# Patient Record
Sex: Male | Born: 1970 | Race: Black or African American | Hispanic: No | Marital: Married | Smoking: Current every day smoker
Health system: Southern US, Community
[De-identification: ages and names within clinical notes are randomized; demographics above are authoritative.]

## PROBLEM LIST (undated history)

## (undated) DIAGNOSIS — R7303 Prediabetes: Secondary | ICD-10-CM

## (undated) DIAGNOSIS — I10 Essential (primary) hypertension: Secondary | ICD-10-CM

## (undated) HISTORY — PX: APPENDECTOMY: SHX54

## (undated) HISTORY — DX: Prediabetes: R73.03

## (undated) HISTORY — PX: LAPAROSCOPIC GASTRIC SLEEVE RESECTION: SHX5895

---

## 2008-07-09 DIAGNOSIS — E291 Testicular hypofunction: Secondary | ICD-10-CM | POA: Insufficient documentation

## 2011-10-14 DIAGNOSIS — Z720 Tobacco use: Secondary | ICD-10-CM | POA: Insufficient documentation

## 2011-10-14 DIAGNOSIS — I10 Essential (primary) hypertension: Secondary | ICD-10-CM | POA: Insufficient documentation

## 2011-10-14 DIAGNOSIS — E669 Obesity, unspecified: Secondary | ICD-10-CM | POA: Insufficient documentation

## 2011-11-10 DIAGNOSIS — G4700 Insomnia, unspecified: Secondary | ICD-10-CM | POA: Insufficient documentation

## 2011-11-10 DIAGNOSIS — J309 Allergic rhinitis, unspecified: Secondary | ICD-10-CM | POA: Insufficient documentation

## 2011-12-02 DIAGNOSIS — G4733 Obstructive sleep apnea (adult) (pediatric): Secondary | ICD-10-CM | POA: Insufficient documentation

## 2011-12-09 DIAGNOSIS — N5089 Other specified disorders of the male genital organs: Secondary | ICD-10-CM | POA: Insufficient documentation

## 2012-12-01 DIAGNOSIS — N529 Male erectile dysfunction, unspecified: Secondary | ICD-10-CM | POA: Insufficient documentation

## 2014-06-28 DIAGNOSIS — Z9884 Bariatric surgery status: Secondary | ICD-10-CM | POA: Insufficient documentation

## 2014-07-01 DIAGNOSIS — E46 Unspecified protein-calorie malnutrition: Secondary | ICD-10-CM | POA: Insufficient documentation

## 2014-09-01 ENCOUNTER — Emergency Department (HOSPITAL_BASED_OUTPATIENT_CLINIC_OR_DEPARTMENT_OTHER)
Admission: EM | Admit: 2014-09-01 | Discharge: 2014-09-01 | Disposition: A | Discharge status: Home / Self Care | Payer: BLUE CROSS/BLUE SHIELD | Attending: Emergency Medicine | Admitting: Emergency Medicine

## 2014-09-01 ENCOUNTER — Emergency Department (HOSPITAL_BASED_OUTPATIENT_CLINIC_OR_DEPARTMENT_OTHER): Payer: BLUE CROSS/BLUE SHIELD

## 2014-09-01 ENCOUNTER — Encounter (HOSPITAL_BASED_OUTPATIENT_CLINIC_OR_DEPARTMENT_OTHER): Payer: Self-pay | Admitting: *Deleted

## 2014-09-01 DIAGNOSIS — S62622A Displaced fracture of medial phalanx of right middle finger, initial encounter for closed fracture: Secondary | ICD-10-CM | POA: Diagnosis not present

## 2014-09-01 DIAGNOSIS — S0990XA Unspecified injury of head, initial encounter: Secondary | ICD-10-CM | POA: Diagnosis present

## 2014-09-01 DIAGNOSIS — S060X1A Concussion with loss of consciousness of 30 minutes or less, initial encounter: Secondary | ICD-10-CM | POA: Diagnosis not present

## 2014-09-01 DIAGNOSIS — Z79899 Other long term (current) drug therapy: Secondary | ICD-10-CM | POA: Diagnosis not present

## 2014-09-01 DIAGNOSIS — I10 Essential (primary) hypertension: Secondary | ICD-10-CM | POA: Diagnosis not present

## 2014-09-01 DIAGNOSIS — R52 Pain, unspecified: Secondary | ICD-10-CM

## 2014-09-01 DIAGNOSIS — S8002XA Contusion of left knee, initial encounter: Secondary | ICD-10-CM | POA: Diagnosis not present

## 2014-09-01 DIAGNOSIS — Y998 Other external cause status: Secondary | ICD-10-CM | POA: Diagnosis not present

## 2014-09-01 DIAGNOSIS — Y92410 Unspecified street and highway as the place of occurrence of the external cause: Secondary | ICD-10-CM | POA: Insufficient documentation

## 2014-09-01 DIAGNOSIS — Y9389 Activity, other specified: Secondary | ICD-10-CM | POA: Diagnosis not present

## 2014-09-01 DIAGNOSIS — S62609A Fracture of unspecified phalanx of unspecified finger, initial encounter for closed fracture: Secondary | ICD-10-CM

## 2014-09-01 DIAGNOSIS — Z87891 Personal history of nicotine dependence: Secondary | ICD-10-CM | POA: Insufficient documentation

## 2014-09-01 HISTORY — DX: Essential (primary) hypertension: I10

## 2014-09-01 MED ORDER — MORPHINE SULFATE 4 MG/ML IJ SOLN
6.0000 mg | Freq: Once | INTRAMUSCULAR | Status: AC
  Administered 2014-09-01: 6 mg via INTRAMUSCULAR
  Filled 2014-09-01: qty 2

## 2014-09-01 MED ORDER — HYDROCODONE-ACETAMINOPHEN 5-325 MG PO TABS: 1.0000 | ORAL_TABLET | Freq: Four times a day (QID) | ORAL | Status: DC | PRN

## 2014-09-01 MED ORDER — NAPROXEN 500 MG PO TABS: 500.0000 mg | ORAL_TABLET | Freq: Two times a day (BID) | ORAL | Status: DC
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
## 2016-08-16 ENCOUNTER — Encounter: Payer: Self-pay | Admitting: *Deleted

## 2016-08-16 ENCOUNTER — Emergency Department
Admission: EM | Admit: 2016-08-16 | Discharge: 2016-08-16 | Disposition: A | Discharge status: Home / Self Care | Payer: BLUE CROSS/BLUE SHIELD | Source: Home / Self Care | Attending: Family Medicine | Admitting: Family Medicine

## 2016-08-16 DIAGNOSIS — I10 Essential (primary) hypertension: Secondary | ICD-10-CM

## 2016-08-16 DIAGNOSIS — R51 Headache: Secondary | ICD-10-CM | POA: Diagnosis not present

## 2016-08-16 DIAGNOSIS — Z9114 Patient's other noncompliance with medication regimen: Secondary | ICD-10-CM

## 2016-08-16 DIAGNOSIS — F17200 Nicotine dependence, unspecified, uncomplicated: Secondary | ICD-10-CM

## 2016-08-16 DIAGNOSIS — R519 Headache, unspecified: Secondary | ICD-10-CM

## 2016-08-16 MED ORDER — AMLODIPINE BESYLATE 5 MG PO TABS: 5.0000 mg | ORAL_TABLET | Freq: Every day | ORAL | 0 refills | Status: DC

## 2016-08-16 MED ORDER — HYDROCHLOROTHIAZIDE 25 MG PO TABS: 25.0000 mg | ORAL_TABLET | Freq: Every day | ORAL | 0 refills | Status: DC

## 2016-08-16 NOTE — Discharge Instructions (Signed)
°  It is very important to establish care with a primary care provider (family doctor) to help monitor and treat your blood pressure.  If you blood pressure continues to be high and uncontrolled, you are at an increased risk of stroke, kidney failure, heart disease, and early death.

## 2016-08-16 NOTE — ED Triage Notes (Signed)
Patient c/o HA since yesterday that woke him up from sleep. He denies any associated symptoms. Taken Aleve @ 4am with some relief. He has been out of antihypertensives for 2 1/2 weeks. He does not have a PCP, reports he gets BP meds from "[facility]".  HCTZ 25mg  and Amlodipine 25mg 

## 2016-08-16 NOTE — ED Provider Notes (Signed)
CSN: [id number]     Arrival date & time 08/16/16  1059 History   First MD Initiated Contact with Patient 08/16/16 1135     Chief Complaint  Patient presents with  . Headache  . Hypertension   (Consider location/radiation/quality/duration/timing/severity/associated sxs/prior Treatment) HPI  Todd Cruz is a 46 y.o. male presenting to UC with c/o generalized headache that is worse in the front left of his head since last night.  HA woke him from his sleep around 4AM, he took Aleve, which allowed him to fall back asleep. He reports hx of prior similar HAs and known hx of HTN but states he has been out of his BP medication, HCTZ and amlodipine, for about 2-3 weeks.  He does not have a PCP but is interested in establishing care at this time.  Denies chest pain or SOB. Denies one sided weakness or numbness. No change in vision or nausea.  Pt requesting refill of his BP medication as well as asking about medication to help him stop smoking. BP in triage- 163/108 pt notes this is "good" for him as he has hx of systolic BP being in 200s.   Past Medical History:  Diagnosis Date  . Hypertension    Past Surgical History:  Procedure Laterality Date  . APPENDECTOMY    . LAPAROSCOPIC GASTRIC SLEEVE RESECTION     Family History  Problem Relation Age of Onset  . Hypertension Mother   . Stroke Mother   . Hypertension Father   . Diabetes Father   . Heart attack Father    Social History  Substance Use Topics  . Smoking status: Current Every Day Smoker    Last attempt to quit: 05/12/2014  . Smokeless tobacco: Never Used  . Alcohol use Yes     Comment: occasionally    Review of Systems  Eyes: Negative for photophobia, pain, discharge and visual disturbance.  Respiratory: Negative for cough and chest tightness.   Cardiovascular: Negative for chest pain, palpitations and leg swelling.  Gastrointestinal: Negative for nausea and vomiting.  Musculoskeletal: Negative for neck pain and neck  stiffness.  Neurological: Positive for headaches. Negative for dizziness, syncope, weakness, light-headedness and numbness.    Allergies  Lisinopril  Home Medications   Prior to Admission medications   Medication Sig Start Date End Date Taking? Authorizing Provider  amLODipine (NORVASC) 5 MG tablet Take 5 mg by mouth daily.    Historical Provider, MD  amLODipine (NORVASC) 5 MG tablet Take 1 tablet (5 mg total) by mouth daily. 08/16/16   Junius FinnerErin O'Malley, PA-C  hydrochlorothiazide (HYDRODIURIL) 25 MG tablet Take 1 tablet (25 mg total) by mouth daily. 08/16/16   Junius FinnerErin O'Malley, PA-C   Meds Ordered and Administered this Visit  Medications - No data to display  BP (!) 163/108 (BP Location: Left Arm)   Pulse 75   Temp 98.3 F (36.8 C) (Oral)   Resp 16   Wt (!) 325 lb (147.4 kg)   SpO2 99%   BMI 47.99 kg/m  No data found.   Physical Exam  Constitutional: He is oriented to person, place, and time. He appears well-developed and well-nourished. No distress.  Pt sitting in exam chair, NAD. Watching television.   HENT:  Head: Normocephalic and atraumatic.  Eyes: EOM are normal. Pupils are equal, round, and reactive to light.  Neck: Normal range of motion. Neck supple.  Cardiovascular: Normal rate and regular rhythm.   Pulmonary/Chest: Effort normal and breath sounds normal. No stridor. No respiratory distress. He  has no wheezes. He has no rales.  Musculoskeletal: Normal range of motion.  Lymphadenopathy:    He has no cervical adenopathy.  Neurological: He is alert and oriented to person, place, and time. No cranial nerve deficit.  CN II-XII in tact. Speech is clear, alert to person, place and time. Normal finger to nose coordination. 5/5 strength in upper and lower extremities bilaterally. Normal gait.  Skin: Skin is warm and dry. He is not diaphoretic.  Psychiatric: He has a normal mood and affect. His behavior is normal.  Nursing note and vitals reviewed.   Urgent Care Course      Procedures (including critical care time)  Labs Review Labs Reviewed - No data to display  Imaging Review No results found.    MDM   1. Generalized headache   2. Uncontrolled hypertension   3. Noncompliance with medications   4. Current smoker    Pt presenting with HA and elevated BP, likely due to uncontrolled HTN from medication non-compliance as he does not currently have a PCP. Pt also requesting medication for smoking cessation despite having quit date in EPIC as 05/12/14.  No red flag symptoms for HA. HA c/w prior and BP "good" per pt.  Will refill his HCTZ and Amlodipine. Resources for PCP provided. Encouraged to f/u within 1-2 weeks for new patient care. Discussed symptoms that warrant emergent care in the ED.     Junius Finner, PA-C 08/16/16 1223    Junius Finner, PA-C 08/16/16 1226

## 2016-08-17 ENCOUNTER — Ambulatory Visit (INDEPENDENT_AMBULATORY_CARE_PROVIDER_SITE_OTHER): Payer: BLUE CROSS/BLUE SHIELD | Admitting: Osteopathic Medicine

## 2016-08-17 ENCOUNTER — Encounter: Payer: Self-pay | Admitting: Osteopathic Medicine

## 2016-08-17 VITALS — BP 174/114 | Ht 67.0 in | Wt 328.0 lb

## 2016-08-17 DIAGNOSIS — Z8249 Family history of ischemic heart disease and other diseases of the circulatory system: Secondary | ICD-10-CM | POA: Insufficient documentation

## 2016-08-17 DIAGNOSIS — N529 Male erectile dysfunction, unspecified: Secondary | ICD-10-CM

## 2016-08-17 DIAGNOSIS — Z9884 Bariatric surgery status: Secondary | ICD-10-CM

## 2016-08-17 DIAGNOSIS — Z720 Tobacco use: Secondary | ICD-10-CM

## 2016-08-17 DIAGNOSIS — Z6841 Body Mass Index (BMI) 40.0 and over, adult: Secondary | ICD-10-CM

## 2016-08-17 DIAGNOSIS — R946 Abnormal results of thyroid function studies: Secondary | ICD-10-CM

## 2016-08-17 DIAGNOSIS — G4733 Obstructive sleep apnea (adult) (pediatric): Secondary | ICD-10-CM

## 2016-08-17 DIAGNOSIS — IMO0001 Reserved for inherently not codable concepts without codable children: Secondary | ICD-10-CM

## 2016-08-17 DIAGNOSIS — E291 Testicular hypofunction: Secondary | ICD-10-CM | POA: Diagnosis not present

## 2016-08-17 DIAGNOSIS — E669 Obesity, unspecified: Secondary | ICD-10-CM

## 2016-08-17 DIAGNOSIS — I10 Essential (primary) hypertension: Secondary | ICD-10-CM

## 2016-08-17 DIAGNOSIS — Z833 Family history of diabetes mellitus: Secondary | ICD-10-CM | POA: Insufficient documentation

## 2016-08-17 DIAGNOSIS — R7989 Other specified abnormal findings of blood chemistry: Secondary | ICD-10-CM

## 2016-08-17 LAB — CBC WITH DIFFERENTIAL/PLATELET
BASOS ABS: 111 {cells}/uL (ref 0–200)
Basophils Relative: 1 %
Eosinophils Absolute: 777 cells/uL — ABNORMAL HIGH (ref 15–500)
Eosinophils Relative: 7 %
HCT: 44.2 % (ref 38.5–50.0)
Hemoglobin: 14.8 g/dL (ref 13.2–17.1)
LYMPHS PCT: 30 %
Lymphs Abs: 3330 cells/uL (ref 850–3900)
MCH: 26.3 pg — AB (ref 27.0–33.0)
MCHC: 33.5 g/dL (ref 32.0–36.0)
MCV: 78.5 fL — ABNORMAL LOW (ref 80.0–100.0)
MPV: 10.4 fL (ref 7.5–12.5)
Monocytes Absolute: 888 cells/uL (ref 200–950)
Monocytes Relative: 8 %
Neutro Abs: 5994 cells/uL (ref 1500–7800)
Neutrophils Relative %: 54 %
Platelets: 273 10*3/uL (ref 140–400)
RBC: 5.63 MIL/uL (ref 4.20–5.80)
RDW: 14 % (ref 11.0–15.0)
WBC: 11.1 10*3/uL — AB (ref 3.8–10.8)

## 2016-08-17 LAB — COMPLETE METABOLIC PANEL WITH GFR
ALBUMIN: 3.9 g/dL (ref 3.6–5.1)
ALT: 15 U/L (ref 9–46)
AST: 20 U/L (ref 10–40)
Alkaline Phosphatase: 96 U/L (ref 40–115)
BUN: 12 mg/dL (ref 7–25)
CHLORIDE: 103 mmol/L (ref 98–110)
CO2: 26 mmol/L (ref 20–31)
Calcium: 9.1 mg/dL (ref 8.6–10.3)
Creat: 0.93 mg/dL (ref 0.60–1.35)
GFR, Est African American: >89 mL/min (ref >=60)
GFR, Est Non African American: >89 mL/min (ref >=60)
Glucose, Bld: 67 mg/dL (ref 65–99)
POTASSIUM: 4.1 mmol/L (ref 3.5–5.3)
Sodium: 138 mmol/L (ref 135–146)
Total Bilirubin: 0.4 mg/dL (ref 0.2–1.2)
Total Protein: 7.4 g/dL (ref 6.1–8.1)

## 2016-08-17 LAB — HEMOGLOBIN A1C
Hgb A1c MFr Bld: 5.6 % (ref <5.7)
Mean Plasma Glucose: 114 mg/dL

## 2016-08-17 LAB — LIPID PANEL
CHOL/HDL RATIO: 4.3 ratio (ref <5.0)
Cholesterol: 164 mg/dL (ref <200)
HDL: 38 mg/dL — AB (ref >40)
LDL Cholesterol: 98 mg/dL (ref <100)
Triglycerides: 141 mg/dL (ref <150)
VLDL: 28 mg/dL (ref <30)

## 2016-08-17 LAB — TSH: TSH: 0.26 m[IU]/L — AB (ref 0.40–4.50)

## 2016-08-17 MED ORDER — TADALAFIL 5 MG PO TABS: 5.0000 mg | ORAL_TABLET | Freq: Every day | ORAL | 11 refills | Status: DC

## 2016-08-17 MED ORDER — VARENICLINE TARTRATE 1 MG PO TABS: 1.0000 mg | ORAL_TABLET | Freq: Two times a day (BID) | ORAL | 1 refills | Status: DC

## 2016-08-17 MED ORDER — VARENICLINE TARTRATE 0.5 MG X 11 & 1 MG X 42 PO MISC: ORAL | 0 refills | Status: DC

## 2016-08-17 NOTE — Progress Notes (Signed)
HPI: Todd Cruz is a 46 y.o. male  who presents to [facility] Todd Cruz today, 08/17/16,  for chief complaint of:  Chief Complaint  Patient presents with  . Establish Care    BLOOD PRESSURE     Essential hypertension - Patient has not had blood pressure check, routine labs, doctor visit in some time. Had been out of his medications for a few weeks prior to going to urgent care with headache. Blood pressure is still elevated at this point. Headache is persistent, no vision change, no chest pain, pressure, shortness of breath.  Obstructive sleep apnea syndrome - this is on the patient's problem list but he states he has never been on CPAP. 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  Hypogonadism in male - he is on patient's problem list, has never been on testosterone replacement. Complains of erectile dysfunction, which previously had been exacerbated by blood pressure medications. His goal is to find blood pressure regimen which does not compromise erectile function.  Obesity: Patient works as Naval architect, obstacles to weight loss include long hours of sedentary work, difficulty 2 pack meals ahead of time, frequently experiences excessive and bothersome hunger symptoms when he is trying to portion control. Status post laparoscopic sleeve gastrectomy, used to be about 400 pounds.  Tobacco abuse: he has cut back a good bit on smoking but would like to consider medication options to help him quit.  Recently seen in our urgent care, records reviewed from 08/16/2016 (yesterday): Complaining of generalized headache, has been out of the premedication 2-3 weeks. Repeat in triage was 163/108. HCTZ and amlodipine were refilled by urgent care.     Past medical history, surgical history, social history and family history reviewed.  Patient Active Problem List   Diagnosis Date Noted  . Protein-calorie malnutrition (HCC) 07/01/2014  . Status post  laparoscopic sleeve gastrectomy 06/28/2014  . Erectile dysfunction 12/01/2012  . Testicular nodule 12/09/2011  . Obstructive sleep apnea syndrome in adult 12/02/2011  . Allergic rhinitis 11/10/2011  . Insomnia 11/10/2011  . Essential hypertension 10/14/2011  . Obesity 10/14/2011  . Tobacco use 10/14/2011  . Hypogonadism in male 07/09/2008    Current medication list and allergy/intolerance information reviewed.   Current Outpatient Prescriptions on File Prior to Visit  Medication Sig Dispense Refill  . amLODipine (NORVASC) 5 MG tablet Take 1 tablet (5 mg total) by mouth daily. 30 tablet 0  . hydrochlorothiazide (HYDRODIURIL) 25 MG tablet Take 1 tablet (25 mg total) by mouth daily. 30 tablet 0   No current facility-administered medications on file prior to visit.    Allergies  Allergen Reactions  . Lisinopril Other (See Comments) and Cough    Headache and cough      Review of Systems:  Constitutional: No recent illness  HEENT: +headache, no vision change  Cardiac: No  chest pain, No  pressure, No palpitations  Respiratory:  No  shortness of breath. No  Cough  Gastrointestinal: No  abdominal pain, no change on bowel habits  Musculoskeletal: No new myalgia/arthralgia  Skin: No  Rash  Hem/Onc: No  easy bruising/bleeding, No  abnormal lumps/bumps  Neurologic: No  weakness, No  Dizziness  Psychiatric: No  concerns with depression, No  concerns with anxiety  Exam:  BP (!) 174/114   Ht 5\' 7"  (1.702 m)   Wt (!) 328 lb (148.8 kg)   BMI 51.37 kg/m   Constitutional: VS see above. General Appearance: alert, well-developed, well-nourished, NAD  Eyes: Normal lids and conjunctive, non-icteric sclera  Ears, Nose, Mouth, Throat: MMM, Normal external inspection ears/nares/mouth/lips/gums.  Neck: No masses, trachea midline.   Respiratory: Normal respiratory effort. no wheeze, no rhonchi, no rales  Cardiovascular: S1/S2 normal, no murmur, no rub/gallop auscultated. RRR.    Musculoskeletal: Gait normal. Symmetric and independent movement of all extremities  Neurological: Normal balance/coordination. No tremor.  Skin: warm, dry, intact.   Psychiatric: Normal judgment/insight. Normal mood and affect. Oriented x3.     ASSESSMENT/PLAN:   Essential hypertension - BP still elevated, has been 1 days since restarted medications, we'll plan to follow-up for recheck, patient advised may need to increase/augment rx - Plan: CBC with Differential/Platelet, COMPLETE METABOLIC PANEL WITH GFR, Lipid panel, TSH, Hemoglobin A1c  Obstructive sleep apnea syndrome in adult - No formal sleep study on file, patient advised that this certainly can be contributing to blood pressure issues, he does not want further workup at this time  Hypogonadism in male - Consider check testosterone if erectile dysfunction medications do not correct the problem - Plan: TSH  Class 3 obesity with serious comorbidity and body mass index (BMI) of 50.0 to 59.9 in adult, unspecified obesity type (HCC) - Extensive discussion on diet/exercise lifestyle modifications for weight loss. Consider pharmacologic therapy pending labs, see patient instructions - Plan: Lipid panel, TSH, Hemoglobin A1c  Status post laparoscopic sleeve gastrectomy  Family history of MI (myocardial infarction)  Family history of diabetes mellitus in first degree relative - Plan: Hemoglobin A1c  Tobacco abuse - Trial Chantix - Plan: varenicline (CHANTIX STARTING MONTH PAK) 0.5 MG X 11 & 1 MG X 42 tablet, varenicline (CHANTIX CONTINUING MONTH PAK) 1 MG tablet  Erectile dysfunction, unspecified erectile dysfunction type - Trial daily Cialis - Plan: tadalafil (CIALIS) 5 MG tablet    Patient Instructions  Plan: Labs today Chantix to help quit smoking Cialis for ED medication Continue current BP meds Follow-up in 1-2 weeks   Your homework:  Research anti-obesity medications (see below)    Tylenol for headache: can take up  to 1000 mg every 6 hours (4 times per day) is maximum dose, this should help headache without making BP worse    See printed patient instructions for weight loss information   Follow-up plan: Return for BP followup and discuss labs 1-2 weeks (OV30) .  Visit summary with medication list and pertinent instructions was printed for patient to review, alert us if any changes needed. All questions at time of visit were answered - patient instructed to contact office with any additional concerns. ER/RTC precautions were reviewed with the patient and understanding verbalized.   Note: Total time spent 45 minutes, greater than 50% of the visit was spent face-to-face counseling and coordinating care for the following: The primary encounter diagnosis was Essential hypertension. Diagnoses of Obstructive sleep apnea syndrome in adult, Hypogonadism in male, Class 3 obesity with serious comorbidity and body mass index (BMI) of 50.0 to 59.9 in adult, unspecified obesity type [facility](HCC), Status post laparoscopic sleeve gastrectomy, Family history of MI (myocardial infarction), Family history of diabetes mellitus in first degree relative, Tobacco abuse, and Erectile dysfunction, unspecified erectile dysfunction type were also pertinent to this visit..Marland Kitchen

## 2016-08-17 NOTE — Patient Instructions (Addendum)
Plan: Labs today Chantix to help quit smoking Cialis for ED medication Continue current BP meds Follow-up in 1-2 weeks   Your homework:  Research anti-obesity medications (see below)          Weight loss: important things to remember  It is hard work! You will have setbacks, but don't get discouraged. The goal is not short-term success, it is long-term health.   Looking at the numbers is important to track your progress and set goals, but how you are feeling and your overall health are the most important things! BMI and pounds and calories and miles logged aren't everything - they are tools to help us reach your goals.  You can do this!!!   Things to remember for exercise for weight loss:   Please note - I am not a certified personal trainer. I can present you with ideas and general workout goals, but an exercise program is largely up to you. Find something you can stick with, and something you enjoy!   As you progress in your exercise regimen think about gradually increasing the following, week by week:   intensity (how strenuous is your workout)  frequency (how often you are exercising)  duration (how many minutes at a time you are exercising)  Walking for 20 minutes a day is certainly better than nothing, but more strenuous exercise will develop better cardiovascular fitness.   interval training (high-intensity alternating with low-intensity, think walk/jog rather than just walk)  muscle strengthening exercises (weight lifting, calisthenics, yoga) - this also helps prevent osteoporosis!   Things to remember for diet changes for weight loss:   Please note - I am not a certified dietician. I can present you with ideas and general diet goals, but a meal plan is largely up to you. I am happy to refer you to a dietician who can give you a detailed meal plan.  Apps/logs are crucial to track how you're eating! It's not realistic to be logging everything you eat forever, but  when you're starting a healthy eating lifestyle it's very helpful, and checking in with logs now and then helps you stick to your program!   Calorie restriction with the goal weight loss of no more than one to one and a half pounds per week.   Increase lean protein such as chicken, fish, [place].   Decrease fatty foods such as dairy, butter.   Decrease sugary foods. Avoid sugary drinks such as soda or juice.  Increase fiber found in fruit and vegetables.  Tylenol for headache: can take up to 1000 mg every 6 hours (4 times per day) is maximum dose, this should help headache without making BP worse   Medications approved for long-term use for obesity  Qsymia (Phentermine and Topiramate) - would avoid in high blood pressure   Saxenda (Liraglutide 3 mg/day)  Contrave (Bupropion and Naltrexone)  Lorcaserin (Belviq or Belviq XR)  Orlistat (Xenical, Alli)  Bupropion (Wellbutrin) I recommend that you research the above medications and see which one(s) your insurance may or may not cover: If you call your insurance, ask them specifically what medications are on their formulary that are approved for obesity treatment. They should be able to send you a list or tell you over the phone. If you look up the individual medications' web sites, there are often savings programs offered through the manufacturers. Remember, medications aren't magic! You MUST be diligent about lifestyle changes as well!

## 2016-08-18 NOTE — Addendum Note (Signed)
Addended by: Deirdre PippinsALEXANDER, Azaylea Maves M on: 08/18/2016 08:12 AM   Modules accepted: Orders

## 2016-08-19 LAB — T4, FREE: Free T4: 1 ng/dL (ref 0.8–1.8)

## 2016-08-19 LAB — T3, FREE: T3, Free: 3.2 pg/mL (ref 2.3–4.2)

## 2016-08-27 ENCOUNTER — Ambulatory Visit: Payer: BLUE CROSS/BLUE SHIELD | Admitting: Osteopathic Medicine

## 2016-11-12 DIAGNOSIS — I10 Essential (primary) hypertension: Secondary | ICD-10-CM | POA: Diagnosis not present

## 2016-11-12 DIAGNOSIS — Z888 Allergy status to other drugs, medicaments and biological substances status: Secondary | ICD-10-CM | POA: Diagnosis not present

## 2016-11-12 DIAGNOSIS — R2243 Localized swelling, mass and lump, lower limb, bilateral: Secondary | ICD-10-CM | POA: Diagnosis not present

## 2016-11-12 DIAGNOSIS — R51 Headache: Secondary | ICD-10-CM | POA: Diagnosis not present

## 2016-11-12 DIAGNOSIS — F17210 Nicotine dependence, cigarettes, uncomplicated: Secondary | ICD-10-CM | POA: Diagnosis not present

## 2017-03-10 DIAGNOSIS — I10 Essential (primary) hypertension: Secondary | ICD-10-CM | POA: Diagnosis not present

## 2017-03-10 DIAGNOSIS — R112 Nausea with vomiting, unspecified: Secondary | ICD-10-CM | POA: Diagnosis not present

## 2017-03-10 DIAGNOSIS — Z9884 Bariatric surgery status: Secondary | ICD-10-CM | POA: Diagnosis not present

## 2017-03-17 DIAGNOSIS — M79604 Pain in right leg: Secondary | ICD-10-CM | POA: Diagnosis not present

## 2017-03-17 DIAGNOSIS — Z888 Allergy status to other drugs, medicaments and biological substances status: Secondary | ICD-10-CM | POA: Diagnosis not present

## 2017-03-17 DIAGNOSIS — E669 Obesity, unspecified: Secondary | ICD-10-CM | POA: Diagnosis not present

## 2017-03-17 DIAGNOSIS — Z79899 Other long term (current) drug therapy: Secondary | ICD-10-CM | POA: Diagnosis not present

## 2017-03-17 DIAGNOSIS — Z9884 Bariatric surgery status: Secondary | ICD-10-CM | POA: Diagnosis not present

## 2017-03-17 DIAGNOSIS — I10 Essential (primary) hypertension: Secondary | ICD-10-CM | POA: Diagnosis not present

## 2017-03-17 DIAGNOSIS — R51 Headache: Secondary | ICD-10-CM | POA: Diagnosis not present

## 2017-03-17 DIAGNOSIS — F17210 Nicotine dependence, cigarettes, uncomplicated: Secondary | ICD-10-CM | POA: Diagnosis not present

## 2017-03-17 DIAGNOSIS — R600 Localized edema: Secondary | ICD-10-CM | POA: Diagnosis not present

## 2017-03-29 ENCOUNTER — Other Ambulatory Visit: Payer: Self-pay

## 2017-03-29 MED ORDER — AMLODIPINE BESYLATE 5 MG PO TABS: 5.0000 mg | ORAL_TABLET | Freq: Every day | ORAL | 0 refills | Status: DC

## 2017-03-29 MED ORDER — HYDROCHLOROTHIAZIDE 25 MG PO TABS
25.0000 mg | ORAL_TABLET | Freq: Every day | ORAL | 0 refills | Status: DC
Start: 2017-03-29 — End: 2017-04-11

## 2017-03-29 NOTE — Telephone Encounter (Signed)
REFILL REQUEST FOR aMLODIPINE AND hYDROCHLORTHIAZIDE. PATIENT ADVISED APPOINTMENT [place] NEEDED FOR FURTHER REFILLS. Todd Cruz,CMA

## 2017-04-08 ENCOUNTER — Emergency Department (INDEPENDENT_AMBULATORY_CARE_PROVIDER_SITE_OTHER)
Admission: EM | Admit: 2017-04-08 | Discharge: 2017-04-08 | Disposition: A | Discharge status: Home / Self Care | Payer: BLUE CROSS/BLUE SHIELD | Source: Home / Self Care | Attending: Family Medicine | Admitting: Family Medicine

## 2017-04-08 ENCOUNTER — Encounter: Payer: Self-pay | Admitting: Emergency Medicine

## 2017-04-08 DIAGNOSIS — I10 Essential (primary) hypertension: Secondary | ICD-10-CM

## 2017-04-08 NOTE — Discharge Instructions (Signed)
Continue present blood pressure medication. Recommend a sleep study to rule out obstructive sleep apnea.

## 2017-04-08 NOTE — ED Triage Notes (Signed)
Concerned about his blood pressure, having headaches and dizziness, he just started taking Amlodipine and HCTZ about one week ago and has not called his PCP, Dr Lyn Hollingshead.

## 2017-04-08 NOTE — ED Provider Notes (Signed)
[facility]    CSN: [id number] Arrival date & time: 04/08/17  4098     History   Chief Complaint Chief Complaint  Patient presents with  . Hypertension    HPI Todd Cruz is a 46 y.o. male.   Patient presents for a blood pressure check, having just resumed his amlodipine  daily and HCTZ  daily, about one week ago.  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  He reports that he meets criteria for possible sleep apnea based on his neck circumference, but denies past history of OSA.  He reports no adverse effects from his meds.  He reports an occasional mild headache without neurologic symptoms.   The history is provided by the patient.  Hypertension  This is a chronic problem. The problem has been gradually improving. Associated symptoms include headaches. Pertinent negatives include no chest pain, no abdominal pain and no shortness of breath. Nothing aggravates the symptoms. The symptoms are relieved by medications.    Past Medical History:  Diagnosis Date  . Hypertension     Patient Active Problem List   Diagnosis Date Noted  . Family history of MI (myocardial infarction) 08/17/2016  . Family history of diabetes mellitus in first degree relative 08/17/2016  . Protein-calorie malnutrition (HCC) 07/01/2014  . 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 Erectile dysfunction 12/01/2012  . Testicular nodule 12/09/2011  . Obstructive sleep apnea syndrome in adult 12/02/2011  . Allergic rhinitis 11/10/2011  . Insomnia 11/10/2011  . Essential hypertension 10/14/2011  . Obesity 10/14/2011  . Tobacco abuse 10/14/2011  . Hypogonadism in male 07/09/2008    Past Surgical History:  Procedure Laterality Date  . APPENDECTOMY    . LAPAROSCOPIC GASTRIC SLEEVE RESECTION         Home Medications    Prior to Admission medications   Medication Sig Start Date End Date Taking? Authorizing Provider  amLODipine (NORVASC) 5 MG tablet Take 1  tablet (5 mg total) by mouth daily. APPOINTMENT NEEDED FOR FURTHER REFILLS 03/29/17   Sunnie Nielsen, DO  hydrochlorothiazide (HYDRODIURIL) 25 MG tablet Take 1 tablet (25 mg total) by mouth daily. APPOINTMENT NEEDED FOR FURTHER REFILLS 03/29/17   Sunnie Nielsen, DO  tadalafil (CIALIS) 5 MG tablet Take 1 tablet (5 mg total) by mouth daily. Use daily. 08/17/16   Sunnie Nielsen, DO    Family History Family History  Problem Relation Age of Onset  . Hypertension Mother   . Stroke Mother   . Hypertension Father   . Diabetes Father   . Heart attack Father     Social History Social History  Substance Use Topics  . Smoking status: Current Every Day Smoker    Last attempt to quit: 05/12/2014  . Smokeless tobacco: Never Used  . Alcohol use Yes     Comment: occasionally     Allergies   Lisinopril   Review of Systems Review of Systems  Respiratory: Negative for shortness of breath.   Cardiovascular: Negative for chest pain.  Gastrointestinal: Negative for abdominal pain.  Neurological: Positive for headaches.  All other systems reviewed and are negative.    Physical Exam Triage Vital Signs ED Triage Vitals  Enc Vitals Group     BP      Pulse      Resp      Temp      Temp src      SpO2      Weight      Height  Head Circumference      Peak Flow      Pain Score      Pain Loc      Pain Edu?      Excl. in GC?    No data found.   Updated Vital Signs BP 131/89 (BP Location: Left Arm)   Pulse 83   Temp 99 F (37.2 C) (Oral)   Ht  (1.702 m)   Wt (!) 332 lb (150.6 kg)   SpO2 99%   BMI 52.00 kg/m   Visual Acuity Right Eye Distance:   Left Eye Distance:   Bilateral Distance:    Right Eye Near:   Left Eye Near:    Bilateral Near:     Physical Exam Nursing notes and Vital Signs reviewed. Appearance:  Patient appears stated age, and in no acute distress Eyes:  Pupils are equal, round, and reactive to light and accomodation.  Extraocular  movement is intact.  Conjunctivae are not inflamed.  Fundi benign  Ears:  Normal externally.  Nose:  Normal turbinates Pharynx:  Normal Neck:  Supple.  Carotids have normal upstrokes without bruits.  Lungs:  Clear to auscultation.  Breath sounds are equal.  Moving air well. Heart:  Regular rate and rhythm without murmurs, rubs, or gallops.  Abdomen:  Nontender without masses or hepatosplenomegaly.  Bowel sounds are present.  No CVA or flank tenderness.  Extremities:  No edema.  Pedal pulses intact. Skin:  No rash present.    UC Treatments / Results  Labs (all labs ordered are listed, but only abnormal results are displayed) Labs Reviewed - No data to display  EKG  EKG Interpretation None       Radiology No results found.  Procedures Procedures (including critical care time)  Medications Ordered in UC Medications - No data to display   Initial Impression / Assessment and Plan / UC Course  I have reviewed the triage vital signs and the nursing notes.  Pertinent labs & imaging results that were available during my care of the patient were reviewed by me and considered in my medical decision making (see chart for details).    Hypertension, improved.  Today's BP measurement 131/89 meets DOT standards.  Recommend that patient pursue a sleep study to rule out OSA. Followup with Family Doctor.    Final Clinical Impressions(s) / UC Diagnoses   Final diagnoses:  Essential hypertension    New Prescriptions Discharge Medication List as of 04/08/2017 10:33 AM           Lattie Haw, MD 04/08/17 1040

## 2017-04-11 ENCOUNTER — Ambulatory Visit (INDEPENDENT_AMBULATORY_CARE_PROVIDER_SITE_OTHER): Payer: BLUE CROSS/BLUE SHIELD | Admitting: Osteopathic Medicine

## 2017-04-11 ENCOUNTER — Encounter: Payer: Self-pay | Admitting: Osteopathic Medicine

## 2017-04-11 VITALS — BP 140/90 | HR 101 | Ht 67.0 in | Wt 334.0 lb

## 2017-04-11 DIAGNOSIS — I10 Essential (primary) hypertension: Secondary | ICD-10-CM

## 2017-04-11 DIAGNOSIS — G4733 Obstructive sleep apnea (adult) (pediatric): Secondary | ICD-10-CM

## 2017-04-11 MED ORDER — AMLODIPINE BESYLATE 10 MG PO TABS: 10.0000 mg | ORAL_TABLET | Freq: Every day | ORAL | 1 refills | Status: DC

## 2017-04-11 MED ORDER — HYDROCHLOROTHIAZIDE 25 MG PO TABS
25.0000 mg | ORAL_TABLET | Freq: Every day | ORAL | 1 refills | Status: DC
Start: 2017-04-11 — End: 2017-11-05

## 2017-04-11 NOTE — Progress Notes (Signed)
HPI: Todd Cruz is a 46 y.o. male  who presents to [name] today, 04/11/17,  for chief complaint of:  Chief Complaint  Patient presents with  . Hypertension    Hasn't followed up with me in some time, last seen 08/17/2016 to establish care and restart blood pressure medications.  Since being back on antihypertensive medications, headaches have improved. Blood pressure recently and issue however as it is high enough to be keeping him from passing his DOT physical. Interestingly, on record review, his blood pressures here as measured with the machine are in normal range but on manual cuff measuring above 140/90.  Previously resistant to workup/treatment for sleep apnea as this would also written his passing his DOT physical.   Past medical history, surgical history, social history and family history reviewed.  Patient Active Problem List   Diagnosis Date Noted  . Family history of MI (myocardial infarction) 08/17/2016  . Family history of diabetes mellitus in first degree relative 08/17/2016  . Protein-calorie malnutrition (HCC) 07/01/2014  . 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 Erectile dysfunction 12/01/2012  . Testicular nodule 12/09/2011  . Obstructive sleep apnea syndrome in adult 12/02/2011  . Allergic rhinitis 11/10/2011  . Insomnia 11/10/2011  . Essential hypertension 10/14/2011  . Obesity 10/14/2011  . Tobacco abuse 10/14/2011  . Hypogonadism in male 07/09/2008    Current medication list and allergy/intolerance information reviewed.   Current Outpatient Prescriptions on File Prior to Visit  Medication Sig Dispense Refill  . amLODipine (NORVASC) 5 MG tablet Take 1 tablet (5 mg total) by mouth daily. APPOINTMENT NEEDED FOR FURTHER REFILLS 30 tablet 0  . hydrochlorothiazide (HYDRODIURIL) 25 MG tablet Take 1 tablet (25 mg total) by mouth daily. APPOINTMENT NEEDED FOR FURTHER REFILLS 30 tablet 0  . tadalafil  (CIALIS) 5 MG tablet Take 1 tablet (5 mg total) by mouth daily. Use daily. 30 tablet 11   No current facility-administered medications on file prior to visit.    Allergies  Allergen Reactions  . Lisinopril Other (See Comments) and Cough    Headache and cough      Review of Systems:  Constitutional: No recent illness  HEENT: No  headache, no vision change  Cardiac: No  chest pain, No  pressure, No palpitations  Respiratory:  No  shortness of breath. No  Cough  Neurologic: No  weakness, No  Dizziness  Psychiatric: No  concerns with depression, No  concerns with anxiety  Exam:  BP 140/90   Pulse (!) 101   Ht  (1.702 m)   Wt (!) 334 lb (151.5 kg)   BMI 52.31 kg/m   Constitutional: VS see above. General Appearance: alert, well-developed, well-nourished, NAD  Eyes: Normal lids and conjunctive, non-icteric sclera  Ears, Nose, Mouth, Throat: MMM, Normal external inspection ears/nares/mouth/lips/gums.  Neck: No masses, trachea midline.   Respiratory: Normal respiratory effort. no wheeze, no rhonchi, no rales  Cardiovascular: S1/S2 normal, no murmur, no rub/gallop auscultated. RRR.   Musculoskeletal: Gait normal. Symmetric and independent movement of all extremities  Neurological: Normal balance/coordination. No tremor.  Skin: warm, dry, intact.   Psychiatric: Normal judgment/insight. Normal mood and affect. Oriented x3.      ASSESSMENT/PLAN:   We'll go ahead and increase amlodipine, continue current HCTZ.  Discussed lifestyle modifications as far as diet/exercise, weight loss, evaluate/treat if needed for sleep apnea.  I think cuff placement on arms isn't issue, he has wide upper arm at biceps and tapers to a  fairly narrow comparatively at the elbow. I think she would be okay to get a home wrist monitor to measure blood pressure though I don't normally recommend these.  Essential hypertension - Plan: amLODipine (NORVASC) 10 MG tablet, hydrochlorothiazide  (HYDRODIURIL) 25 MG tablet, Ambulatory referral to Sleep Studies  Obstructive sleep apnea syndrome in adult - Plan: Ambulatory referral to Sleep Studies    Patient Instructions  Plan 1. Increase medications 2. Get sleep study done 3. Start measuring BP at home   Plan to return for nurse visit to verify home blood pressure cuff. In the meantime, be keeping a record of your blood pressures at home and bring this with you to the visit with the nurse.   If your cuff is measuring within 5-10 points of ours AND your home numbers are less than less than 130/80 then nothing else to do.   If your home blood pressure cuff is inaccurate or is accurate but measuring above goal, we will need to talk about adjusting your medications.       Follow-up plan: Return in about 1 week (around 04/18/2017) for nurse visit BP check .  Visit summary with medication list and pertinent instructions was printed for patient to review, alert [place] if any changes needed. All questions at time of visit were answered - patient instructed to contact office with any additional concerns. ER/RTC precautions were reviewed with the patient and understanding verbalized.   Note: Total time spent 25 minutes, greater than 50% of the visit was spent face-to-face counseling and coordinating care for the following: The primary encounter diagnosis was Essential hypertension. A diagnosis of Obstructive sleep apnea syndrome in adult was also pertinent to this visit.Marland Kitchen

## 2017-04-11 NOTE — Patient Instructions (Addendum)
Plan 1. Increase medications 2. Get sleep study done 3. Start measuring BP at home   Plan to return for nurse visit to verify home blood pressure cuff. In the meantime, be keeping a record of your blood pressures at home and bring this with you to the visit with the nurse.   If your cuff is measuring within 5-10 points of ours AND your home numbers are less than less than 130/80 then nothing else to do.   If your home blood pressure cuff is inaccurate or is accurate but measuring above goal, we will need to talk about adjusting your medications.      DASH Eating Plan DASH stands for "Dietary Approaches to Stop Hypertension." The DASH eating plan is a healthy eating plan that has been shown to reduce high blood pressure (hypertension). It may also reduce your risk for type 2 diabetes, heart disease, and stroke. The DASH eating plan may also help with weight loss. What are tips for following this plan? General guidelines  Avoid eating more than 2,300 mg (milligrams) of salt (sodium) a day. If you have hypertension, you may need to reduce your sodium intake to 1,500 mg a day.  Limit alcohol intake to no more than 1 drink a day for nonpregnant women and 2 drinks a day for men. One drink equals 12 oz of beer, 5 oz of wine, or 1 oz of hard liquor.  Work with your health care provider to maintain a healthy body weight or to lose weight. Ask what an ideal weight is for you.  Get at least 30 minutes of exercise that causes your heart to beat faster (aerobic exercise) most days of the week. Activities may include walking, swimming, or biking.  Work with your health care provider or diet and nutrition specialist (dietitian) to adjust your eating plan to your individual calorie needs. Reading food labels  Check food labels for the amount of sodium per serving. Choose foods with less than 5 percent of the Daily Value of sodium. Generally, foods with less than 300 mg of sodium per serving fit into  this eating plan.  To find whole grains, look for the word "whole" as the first word in the ingredient list. Shopping  Buy products labeled as "low-sodium" or "no salt added."  Buy fresh foods. Avoid canned foods and premade or frozen meals. Cooking  Avoid adding salt when cooking. Use salt-free seasonings or herbs instead of table salt or sea salt. Check with your health care provider or pharmacist before using salt substitutes.  Do not fry foods. Cook foods using healthy methods such as baking, boiling, grilling, and broiling instead.  Cook with heart-healthy oils, such as olive, canola, soybean, or sunflower oil. Meal planning   Eat a balanced diet that includes: ? 5 or more servings of fruits and vegetables each day. At each meal, try to fill half of your plate with fruits and vegetables. ? Up to 6-8 servings of whole grains each day. ? Less than 6 oz of lean meat, poultry, or fish each day. A 3-oz serving of meat is about the same size as a deck of cards. One egg equals 1 oz. ? 2 servings of low-fat dairy each day. ? A serving of nuts, seeds, or beans 5 times each week. ? Heart-healthy fats. Healthy fats called Omega-3 fatty acids are found in foods such as flaxseeds and coldwater fish, like sardines, salmon, and mackerel.  Limit how much you eat of the following: ? Canned  or prepackaged foods. ? Food that is high in trans fat, such as fried foods. ? Food that is high in saturated fat, such as fatty meat. ? Sweets, desserts, sugary drinks, and other foods with added sugar. ? Full-fat dairy products.  Do not salt foods before eating.  Try to eat at least 2 vegetarian meals each week.  Eat more home-cooked food and less restaurant, buffet, and fast food.  When eating at a restaurant, ask that your food be prepared with less salt or no salt, if possible. What foods are recommended? The items listed may not be a complete list. Talk with your dietitian about what dietary  choices are best for you. Grains Whole-grain or whole-wheat bread. Whole-grain or whole-wheat pasta. Brown rice. Orpah Cobb. Bulgur. Whole-grain and low-sodium cereals. Pita bread. Low-fat, low-sodium crackers. Whole-wheat flour tortillas. Vegetables Fresh or frozen vegetables (raw, steamed, roasted, or grilled). Low-sodium or reduced-sodium tomato and vegetable juice. Low-sodium or reduced-sodium tomato sauce and tomato paste. Low-sodium or reduced-sodium canned vegetables. Fruits All fresh, dried, or frozen fruit. Canned fruit in natural juice (without added sugar). Meat and other protein foods Skinless chicken or [place]. Ground chicken or [place]. Pork with fat trimmed off. Fish and seafood. Egg whites. Dried beans, peas, or lentils. Unsalted nuts, nut butters, and seeds. Unsalted canned beans. Lean cuts of beef with fat trimmed off. Low-sodium, lean deli meat. Dairy Low-fat (1%) or fat-free (skim) milk. Fat-free, low-fat, or reduced-fat cheeses. Nonfat, low-sodium ricotta or cottage cheese. Low-fat or nonfat yogurt. Low-fat, low-sodium cheese. Fats and oils Soft margarine without trans fats. Vegetable oil. Low-fat, reduced-fat, or light mayonnaise and salad dressings (reduced-sodium). Canola, safflower, olive, soybean, and sunflower oils. Avocado. Seasoning and other foods Herbs. Spices. Seasoning mixes without salt. Unsalted popcorn and pretzels. Fat-free sweets. What foods are not recommended? The items listed may not be a complete list. Talk with your dietitian about what dietary choices are best for you. Grains Baked goods made with fat, such as croissants, muffins, or some breads. Dry pasta or rice meal packs. Vegetables Creamed or fried vegetables. Vegetables in a cheese sauce. Regular canned vegetables (not low-sodium or reduced-sodium). Regular canned tomato sauce and paste (not low-sodium or reduced-sodium). Regular tomato and vegetable juice (not low-sodium or reduced-sodium).  Rosita Fire. Olives. Fruits Canned fruit in a light or heavy syrup. Fried fruit. Fruit in cream or butter sauce. Meat and other protein foods Fatty cuts of meat. Ribs. Fried meat. Tomasa Blase. Sausage. Bologna and other processed lunch meats. Salami. Fatback. Hotdogs. Bratwurst. Salted nuts and seeds. Canned beans with added salt. Canned or smoked fish. Whole eggs or egg yolks. Chicken or [place] with skin. Dairy Whole or 2% milk, cream, and half-and-half. Whole or full-fat cream cheese. Whole-fat or sweetened yogurt. Full-fat cheese. Nondairy creamers. Whipped toppings. Processed cheese and cheese spreads. Fats and oils Butter. Stick margarine. Lard. Shortening. Ghee. Bacon fat. Tropical oils, such as coconut, palm kernel, or palm oil. Seasoning and other foods Salted popcorn and pretzels. Onion salt, garlic salt, seasoned salt, table salt, and sea salt. Worcestershire sauce. Tartar sauce. Barbecue sauce. Teriyaki sauce. Soy sauce, including reduced-sodium. Steak sauce. Canned and packaged gravies. Fish sauce. Oyster sauce. Cocktail sauce. Horseradish that you find on the shelf. Ketchup. Mustard. Meat flavorings and tenderizers. Bouillon cubes. Hot sauce and Tabasco sauce. Premade or packaged marinades. Premade or packaged taco seasonings. Relishes. Regular salad dressings. Where to find more information:  National Heart, Lung, and Blood Institute: PopSteam.is  American Heart Association: www.heart.org Summary  The  DASH eating plan is a healthy eating plan that has been shown to reduce high blood pressure (hypertension). It may also reduce your risk for type 2 diabetes, heart disease, and stroke.  With the DASH eating plan, you should limit salt (sodium) intake to 2,300 mg a day. If you have hypertension, you may need to reduce your sodium intake to 1,500 mg a day.  When on the DASH eating plan, aim to eat more fresh fruits and vegetables, whole grains, lean proteins, low-fat dairy, and  heart-healthy fats.  Work with your health care provider or diet and nutrition specialist (dietitian) to adjust your eating plan to your individual calorie needs. This information is not intended to replace advice given to you by your health care provider. Make sure you discuss any questions you have with your health care provider. Document Released: 06/17/2011 Document Revised: 06/21/2016 Document Reviewed: 06/21/2016 Elsevier Interactive Patient Education  2017 [employer].    Mediterranean Diet A Mediterranean diet refers to food and lifestyle choices that are based on the traditions of countries located on the [employer]. This way of eating has been shown to help prevent certain conditions and improve outcomes for people who have chronic diseases, like kidney disease and heart disease. What are tips for following this plan? Lifestyle  Cook and eat meals together with your family, when possible.  Drink enough fluid to keep your urine clear or pale yellow.  Be physically active every day. This includes: ? Aerobic exercise like running or swimming. ? Leisure activities like gardening, walking, or housework.  Get 7-8 hours of sleep each night.  If recommended by your health care provider, drink red wine in moderation. This means 1 glass a day for nonpregnant women and 2 glasses a day for men. A glass of wine equals 5 oz (150 mL). Reading food labels  Check the serving size of packaged foods. For foods such as rice and pasta, the serving size refers to the amount of cooked product, not dry.  Check the total fat in packaged foods. Avoid foods that have saturated fat or trans fats.  Check the ingredients list for added sugars, such as corn syrup. Shopping  At the grocery store, buy most of your food from the areas near the walls of the store. This includes: ? Fresh fruits and vegetables (produce). ? Grains, beans, nuts, and seeds. Some of these may be available in unpackaged  forms or large amounts (in bulk). ? Fresh seafood. ? Poultry and eggs. ? Low-fat dairy products.  Buy whole ingredients instead of prepackaged foods.  Buy fresh fruits and vegetables in-season from local farmers markets.  Buy frozen fruits and vegetables in resealable bags.  If you do not have access to quality fresh seafood, buy precooked frozen shrimp or canned fish, such as tuna, salmon, or sardines.  Buy small amounts of raw or cooked vegetables, salads, or olives from the deli or salad bar at your store.  Stock your pantry so you always have certain foods on hand, such as olive oil, canned tuna, canned tomatoes, rice, pasta, and beans. Cooking  Cook foods with extra-virgin olive oil instead of using butter or other vegetable oils.  Have meat as a side dish, and have vegetables or grains as your main dish. This means having meat in small portions or adding small amounts of meat to foods like pasta or stew.  Use beans or vegetables instead of meat in common dishes like chili or lasagna.  Experiment with different  cooking methods. Try roasting or broiling vegetables instead of steaming or sauteing them.  Add frozen vegetables to soups, stews, pasta, or rice.  Add nuts or seeds for added healthy fat at each meal. You can add these to yogurt, salads, or vegetable dishes.  Marinate fish or vegetables using olive oil, lemon juice, garlic, and fresh herbs. Meal planning  Plan to eat 1 vegetarian meal one day each week. Try to work up to 2 vegetarian meals, if possible.  Eat seafood 2 or more times a week.  Have healthy snacks readily available, such as: ? Vegetable sticks with hummus. ? [place] yogurt. ? Fruit and nut trail mix.  Eat balanced meals throughout the week. This includes: ? Fruit: 2-3 servings a day ? Vegetables: 4-5 servings a day ? Low-fat dairy: 2 servings a day ? Fish, poultry, or lean meat: 1 serving a day ? Beans and legumes: 2 or more servings a  week ? Nuts and seeds: 1-2 servings a day ? Whole grains: 6-8 servings a day ? Extra-virgin olive oil: 3-4 servings a day  Limit red meat and sweets to only a few servings a month What are my food choices?  Mediterranean diet ? Recommended ? Grains: Whole-grain pasta. Brown rice. Bulgar wheat. Polenta. Couscous. Whole-wheat bread. Orpah Cobb. ? Vegetables: Artichokes. Beets. Broccoli. Cabbage. Carrots. Eggplant. Green beans. Chard. Kale. Spinach. Onions. Leeks. Peas. Squash. Tomatoes. Peppers. Radishes. ? Fruits: Apples. Apricots. Avocado. Berries. Bananas. Cherries. Dates. Figs. Grapes. Lemons. Melon. Oranges. Peaches. Plums. Pomegranate. ? Meats and other protein foods: Beans. Almonds. Sunflower seeds. Pine nuts. Peanuts. Cod. Salmon. Scallops. Shrimp. Tuna. Tilapia. Clams. Oysters. Eggs. ? Dairy: Low-fat milk. Cheese. Greek yogurt. ? Beverages: Water. Red wine. Herbal tea. ? Fats and oils: Extra virgin olive oil. Avocado oil. Grape seed oil. ? Sweets and desserts: [place] yogurt with honey. Baked apples. Poached pears. Trail mix. ? Seasoning and other foods: Basil. Cilantro. Coriander. Cumin. Mint. Parsley. Sage. Rosemary. Tarragon. Garlic. Oregano. Thyme. Pepper. Balsalmic vinegar. Tahini. Hummus. Tomato sauce. Olives. Mushrooms. ? Limit these ? Grains: Prepackaged pasta or rice dishes. Prepackaged cereal with added sugar. ? Vegetables: Deep fried potatoes (french fries). ? Fruits: Fruit canned in syrup. ? Meats and other protein foods: Beef. Pork. Lamb. Poultry with skin. Hot dogs. Tomasa Blase. ? Dairy: Ice cream. Sour cream. Whole milk. ? Beverages: Juice. Sugar-sweetened soft drinks. Beer. Liquor and spirits. ? Fats and oils: Butter. Canola oil. Vegetable oil. Beef fat (tallow). Lard. ? Sweets and desserts: Cookies. Cakes. Pies. Candy. ? Seasoning and other foods: Mayonnaise. Premade sauces and marinades. ? The items listed may not be a complete list. Talk with your dietitian about  what dietary choices are right for you. Summary  The Mediterranean diet includes both food and lifestyle choices.  Eat a variety of fresh fruits and vegetables, beans, nuts, seeds, and whole grains.  Limit the amount of red meat and sweets that you eat.  Talk with your health care provider about whether it is safe for you to drink red wine in moderation. This means 1 glass a day for nonpregnant women and 2 glasses a day for men. A glass of wine equals 5 oz (150 mL). This information is not intended to replace advice given to you by your health care provider. Make sure you discuss any questions you have with your health care provider. Document Released: 02/19/2016 Document Revised: 03/23/2016 Document Reviewed: 02/19/2016 Elsevier Interactive Patient Education  [employer].

## 2017-04-12 ENCOUNTER — Encounter: Payer: Self-pay | Admitting: Osteopathic Medicine

## 2017-04-18 ENCOUNTER — Ambulatory Visit (INDEPENDENT_AMBULATORY_CARE_PROVIDER_SITE_OTHER): Payer: BLUE CROSS/BLUE SHIELD | Admitting: Family Medicine

## 2017-04-18 VITALS — BP 133/81 | HR 93

## 2017-04-18 DIAGNOSIS — I10 Essential (primary) hypertension: Secondary | ICD-10-CM | POA: Diagnosis not present

## 2017-04-18 NOTE — Progress Notes (Signed)
Pt came into clinic today for BP check. Pt reports taking his mediations as prescribed, he took them today about an hour ago. Pt denies any chest pain, palpations, headaches, dizziness. Pt's BP was elevated in office today on first check, but did go down into normal range. Will route to Provider in office today for review. Pt advise we would contact him regarding any changes, and to keep his follow up with PCP. Verbalized understanding.

## 2017-04-18 NOTE — Progress Notes (Signed)
HTN - BP is improved overall but I would recommend we had a low-dose of with the losartan to his regimen of amlodipine and HCTZ as well. In fact there is a medication that has all 3 and 1 call try been sore that we could also try. We see if he has a preference.

## 2017-04-22 NOTE — Progress Notes (Signed)
Left VM for Pt to return clinic call.  

## 2017-07-29 DIAGNOSIS — R112 Nausea with vomiting, unspecified: Secondary | ICD-10-CM | POA: Diagnosis not present

## 2017-07-29 DIAGNOSIS — I10 Essential (primary) hypertension: Secondary | ICD-10-CM | POA: Diagnosis not present

## 2017-08-05 DIAGNOSIS — Z7189 Other specified counseling: Secondary | ICD-10-CM | POA: Diagnosis not present

## 2017-08-05 DIAGNOSIS — F54 Psychological and behavioral factors associated with disorders or diseases classified elsewhere: Secondary | ICD-10-CM | POA: Diagnosis not present

## 2017-08-16 ENCOUNTER — Ambulatory Visit (INDEPENDENT_AMBULATORY_CARE_PROVIDER_SITE_OTHER): Payer: BLUE CROSS/BLUE SHIELD | Admitting: Physician Assistant

## 2017-08-16 ENCOUNTER — Encounter: Payer: Self-pay | Admitting: Physician Assistant

## 2017-08-16 VITALS — BP 181/98 | HR 104 | Wt 352.0 lb

## 2017-08-16 DIAGNOSIS — I358 Other nonrheumatic aortic valve disorders: Secondary | ICD-10-CM

## 2017-08-16 DIAGNOSIS — I10 Essential (primary) hypertension: Secondary | ICD-10-CM

## 2017-08-16 DIAGNOSIS — R351 Nocturia: Secondary | ICD-10-CM

## 2017-08-16 DIAGNOSIS — Z131 Encounter for screening for diabetes mellitus: Secondary | ICD-10-CM | POA: Diagnosis not present

## 2017-08-16 DIAGNOSIS — N529 Male erectile dysfunction, unspecified: Secondary | ICD-10-CM | POA: Diagnosis not present

## 2017-08-16 DIAGNOSIS — R3121 Asymptomatic microscopic hematuria: Secondary | ICD-10-CM | POA: Diagnosis not present

## 2017-08-16 DIAGNOSIS — R399 Unspecified symptoms and signs involving the genitourinary system: Secondary | ICD-10-CM | POA: Diagnosis not present

## 2017-08-16 LAB — POCT URINALYSIS DIPSTICK
BILIRUBIN UA: NEGATIVE
GLUCOSE UA: NEGATIVE
Ketones, UA: NEGATIVE
Nitrite, UA: NEGATIVE
Protein, UA: NEGATIVE
RBC UA: NEGATIVE
Spec Grav, UA: 1.015 (ref 1.010–1.025)
Urobilinogen, UA: 0.2 E.U./dL
pH, UA: 7.5 (ref 5.0–8.0)

## 2017-08-16 MED ORDER — TADALAFIL 5 MG PO TABS: 5.0000 mg | ORAL_TABLET | Freq: Every day | ORAL | 11 refills | Status: DC

## 2017-08-16 MED ORDER — METOPROLOL SUCCINATE ER 50 MG PO TB24: 50.0000 mg | ORAL_TABLET | Freq: Every day | ORAL | 3 refills | Status: DC

## 2017-08-16 NOTE — Patient Instructions (Addendum)
For your blood pressure: - Goal <130/80 - continue your Amlodipine and Hydrochlorothiazide daily - start Metoprolol daily. Take it with your other medications - baby aspirin 81 mg daily to help prevent heart attack/stroke - monitor and log blood pressures at home - check around the same time each day in a relaxed setting - Limit salt to <2000 mg/day - Follow DASH eating plan - use your CPAP nightly. It is very difficult to control blood pressure if sleep apnea is poorly controlled - limit alcohol to 2 standard drinks per day for men and 1 per day for women - avoid tobacco products - weight loss: 7% of current body weight1 - follow-up every 6 months for your blood pressure

## 2017-08-16 NOTE — Progress Notes (Signed)
HPI:                                                                Todd Cruz is a 47 y.o. 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 He endorses chronic nocturia >2 episodes per night and increased urinary frequency, which he attributes to his thiazide. He also reports erectile dysfunction. He is prescribed Cialis 5 mg daily, but states the pharmacy only gives him 4 pills per month, which do not work for his ED. He denies gross hematuria, dysuria, hesitancy, urgency, decreased urinary stream, or incomplete bladder emptying.  HTN: taking Amlodipine and HCTZ daily. Compliant with medications. Does not check BP's at home. He does have OSA and is compliant with CPAP. Denies vision change, headache, chest pain with exertion, orthopnea, lightheadedness, syncope. Risk factors include: male sex, tobacco use, obesity, family history of CVD   Depression screen [facility] 2/9 04/18/2017 08/17/2016  Decreased Interest 0 0  Down, Depressed, Hopeless 0 0  PHQ - 2 Score 0 0    No flowsheet data found.    Past Medical History:  Diagnosis Date  . Hypertension    Past Surgical History:  Procedure Laterality Date  . APPENDECTOMY    . LAPAROSCOPIC GASTRIC SLEEVE RESECTION     Social History   Tobacco Use  . Smoking status: Current Every Day Smoker    Years: 10.00    Types: Cigarettes    Last attempt to quit: 05/12/2014    Years since quitting: 3.2  . Smokeless tobacco: Never Used  Substance Use Topics  . Alcohol use: Yes    Comment: occasionally   family history includes Diabetes in his father; Heart attack in his father; Hypertension in his father and mother; Stroke in his mother.    ROS: negative except as noted in the HPI  Medications: Current Outpatient Medications   Medication Sig Dispense Refill  . amLODipine (NORVASC) 10 MG tablet Take 1 tablet (10 mg total) by mouth daily. 30 tablet 1  . hydrochlorothiazide (HYDRODIURIL) 25 MG tablet Take 1 tablet (25 mg total) by mouth daily. 90 tablet 1  . tadalafil (CIALIS) 5 MG tablet Take 1 tablet (5 mg total) by mouth daily. Use daily. 30 tablet 11  . metoprolol succinate (TOPROL-XL) 50 MG 24 hr tablet Take 1 tablet (50 mg total) by mouth daily. Take with or immediately following a meal. 90 tablet 3   No current facility-administered medications for this visit.    Allergies  Allergen Reactions  . Lisinopril Other (See Comments) and Cough    Headache and cough       Objective:  BP (!) 181/98   Pulse (!) 104   Wt (!) 352 lb (159.7 kg)   BMI 55.13 kg/m  Gen:  alert, not ill-appearing, no distress, appropriate for age, obese male HEENT: head normocephalic without obvious abnormality, conjunctiva and cornea clear, trachea midline Pulm: Normal work of breathing, normal phonation, clear to auscultation bilaterally, no wheezes, rales or rhonchi CV: tachycardic, regular rhythm, s1 and s2 distinct, grade III/VI  systolic murmur heard best at the RUSB, no clicks or rubs  Neuro: alert and oriented x 3, no tremor MSK: extremities atraumatic, normal gait and station, 1+ peripheral edema to the midcalf Skin: intact, no rashes on exposed skin, no jaundice, no cyanosis  Lab Results  Component Value Date   CREATININE 0.93 08/17/2016   BUN 12 08/17/2016   NA 138 08/17/2016   K 4.1 08/17/2016   CL 103 08/17/2016   CO2 26 08/17/2016       Results for orders placed or performed in visit on 08/16/17 (from the past 72 hour(s))  POCT Urinalysis Dipstick     Status: Abnormal   Collection Time: 08/16/17  4:51 PM  Result Value Ref Range   Color, UA dark yellow    Clarity, UA clear    Glucose, UA negative    Bilirubin, UA negative    Ketones, UA negative    Spec Grav, UA 1.015 1.010 - 1.025   Blood, UA  negative    pH, UA 7.5 5.0 - 8.0   Protein, UA negative    Urobilinogen, UA 0.2 0.2 or 1.0 E.U./dL   Nitrite, UA negative    Leukocytes, UA Small (1+) (A) Negative   Appearance     Odor     No results found.    Assessment and Plan: 47 y.o. male with   1. Uncontrolled stage 2 hypertension BP Readings from Last 3 Encounters:  08/16/17 (!) 181/98  04/18/17 133/81  04/11/17 140/90  - adding Metoprolol to Amlodipine and HCTZ - counseled on importance of CPAP therapy for controlling blood pressure - counseled on therapeutic lifestyle changes - checking UA micro for casts and RBCs, checking renal function - metoprolol succinate (TOPROL-XL) 50 MG 24 hr tablet; Take 1 tablet (50 mg total) by mouth daily. Take with or immediately following a meal.  Dispense: 90 tablet; Refill: 3 - Basic metabolic panel - Urinalysis, microscopic only  2. Asymptomatic microscopic hematuria - POCT Urinalysis Dipstick negative for blood and protein today. Positive for leuks - Urinalysis, microscopic only  3. Nocturia - Ambulatory referral to Urology - tadalafil (CIALIS) 5 MG tablet; Take 1 tablet (5 mg total) by mouth daily. Use daily.  Dispense: 30 tablet; Refill: 11 - POCT Urinalysis Dipstick - Urine Culture  4. Vasculogenic erectile dysfunction, unspecified vasculogenic erectile dysfunction type - Ambulatory referral to Urology  5. Screening for diabetes mellitus - Hemoglobin A1c   7. Lower urinary tract symptoms (LUTS) - tadalafil (CIALIS) 5 MG tablet; Take 1 tablet (5 mg total) by mouth daily. Use daily.  Dispense: 30 tablet; Refill: 11 - POCT Urinalysis Dipstick - Urine Culture  8. Aortic systolic murmur on examination - patient reports this was diagnosed in 2015 and they he had a normal echo and normal stress test. I do not have access to these records, but I am able to see 2 cardiology encounters [facility] - recommend follow-up with PCP and possible  echocardiogram    Patient education and anticipatory guidance given Patient agrees with treatment plan Follow-up with PCP in 2 weeks for HTN or sooner as needed if symptoms worsen or fail to improve  Levonne Hubertharley E. Teila Skalsky PA-C

## 2017-08-17 DIAGNOSIS — Z888 Allergy status to other drugs, medicaments and biological substances status: Secondary | ICD-10-CM | POA: Diagnosis not present

## 2017-08-17 DIAGNOSIS — Z79899 Other long term (current) drug therapy: Secondary | ICD-10-CM | POA: Diagnosis not present

## 2017-08-17 DIAGNOSIS — Z6841 Body Mass Index (BMI) 40.0 and over, adult: Secondary | ICD-10-CM | POA: Diagnosis not present

## 2017-08-17 DIAGNOSIS — M549 Dorsalgia, unspecified: Secondary | ICD-10-CM | POA: Diagnosis not present

## 2017-08-17 DIAGNOSIS — K2950 Unspecified chronic gastritis without bleeding: Secondary | ICD-10-CM | POA: Diagnosis not present

## 2017-08-17 DIAGNOSIS — G4733 Obstructive sleep apnea (adult) (pediatric): Secondary | ICD-10-CM | POA: Diagnosis not present

## 2017-08-17 DIAGNOSIS — I10 Essential (primary) hypertension: Secondary | ICD-10-CM | POA: Diagnosis not present

## 2017-08-17 DIAGNOSIS — K449 Diaphragmatic hernia without obstruction or gangrene: Secondary | ICD-10-CM | POA: Diagnosis not present

## 2017-08-17 DIAGNOSIS — Z87891 Personal history of nicotine dependence: Secondary | ICD-10-CM | POA: Diagnosis not present

## 2017-08-17 DIAGNOSIS — J9811 Atelectasis: Secondary | ICD-10-CM | POA: Diagnosis not present

## 2017-08-17 DIAGNOSIS — Z9884 Bariatric surgery status: Secondary | ICD-10-CM | POA: Diagnosis not present

## 2017-08-17 DIAGNOSIS — K2970 Gastritis, unspecified, without bleeding: Secondary | ICD-10-CM | POA: Diagnosis not present

## 2017-08-17 DIAGNOSIS — K219 Gastro-esophageal reflux disease without esophagitis: Secondary | ICD-10-CM | POA: Diagnosis not present

## 2017-08-17 DIAGNOSIS — Z01818 Encounter for other preprocedural examination: Secondary | ICD-10-CM | POA: Diagnosis not present

## 2017-08-17 DIAGNOSIS — R5383 Other fatigue: Secondary | ICD-10-CM | POA: Diagnosis not present

## 2017-08-17 DIAGNOSIS — Z4682 Encounter for fitting and adjustment of non-vascular catheter: Secondary | ICD-10-CM | POA: Diagnosis not present

## 2017-08-17 DIAGNOSIS — M2550 Pain in unspecified joint: Secondary | ICD-10-CM | POA: Diagnosis not present

## 2017-08-17 DIAGNOSIS — R0603 Acute respiratory distress: Secondary | ICD-10-CM | POA: Diagnosis not present

## 2017-08-17 DIAGNOSIS — E785 Hyperlipidemia, unspecified: Secondary | ICD-10-CM | POA: Diagnosis not present

## 2017-08-17 LAB — URINE CULTURE
MICRO NUMBER:: 90155039
Result:: NO GROWTH
SPECIMEN QUALITY:: ADEQUATE

## 2017-08-18 DIAGNOSIS — I10 Essential (primary) hypertension: Secondary | ICD-10-CM | POA: Diagnosis not present

## 2017-08-18 DIAGNOSIS — Z131 Encounter for screening for diabetes mellitus: Secondary | ICD-10-CM | POA: Diagnosis not present

## 2017-08-18 DIAGNOSIS — R3121 Asymptomatic microscopic hematuria: Secondary | ICD-10-CM | POA: Diagnosis not present

## 2017-08-18 DIAGNOSIS — Z6841 Body Mass Index (BMI) 40.0 and over, adult: Secondary | ICD-10-CM | POA: Diagnosis not present

## 2017-08-18 DIAGNOSIS — Z9884 Bariatric surgery status: Secondary | ICD-10-CM | POA: Diagnosis not present

## 2017-08-19 LAB — URINALYSIS, MICROSCOPIC ONLY
Bacteria, UA: NONE SEEN /HPF
Hyaline Cast: NONE SEEN /LPF
RBC / HPF: NONE SEEN /HPF (ref 0–2)

## 2017-08-19 LAB — BASIC METABOLIC PANEL
BUN: 18 mg/dL (ref 7–25)
CO2: 27 mmol/L (ref 20–32)
Calcium: 9.3 mg/dL (ref 8.6–10.3)
Chloride: 104 mmol/L (ref 98–110)
Creat: 0.83 mg/dL (ref 0.60–1.35)
Glucose, Bld: 119 mg/dL — ABNORMAL HIGH (ref 65–99)
POTASSIUM: 4.5 mmol/L (ref 3.5–5.3)
SODIUM: 141 mmol/L (ref 135–146)

## 2017-08-19 LAB — HEMOGLOBIN A1C
HEMOGLOBIN A1C: 6.1 %{Hb} — AB (ref <5.7)
Mean Plasma Glucose: 128 (calc)
eAG (mmol/L): 7.1 (calc)

## 2017-08-21 ENCOUNTER — Encounter: Payer: Self-pay | Admitting: Physician Assistant

## 2017-08-21 DIAGNOSIS — R7303 Prediabetes: Secondary | ICD-10-CM

## 2017-08-21 HISTORY — DX: Prediabetes: R73.03

## 2017-08-21 NOTE — Progress Notes (Signed)
Todd Cruz,  Your kidney function looks great! No evidence of kidney disease.  Your A1c is in the prediabetic range. Recommend you follow-up with your PCP to discuss seeing a nutritionist to prevent progression to diabetes. There are even some diabetes prevention programs in the community, such as through the [facility].  In the meantime, work on decreasing your carbohydrate and sugar intake and increasing fiber, veggie, fruits, and lean protein intake. Decrease your calories to help lose weight. Avoid saturated fats and processed foods.  Best, Vinetta Bergamoharley

## 2017-08-22 ENCOUNTER — Ambulatory Visit (INDEPENDENT_AMBULATORY_CARE_PROVIDER_SITE_OTHER): Payer: BLUE CROSS/BLUE SHIELD | Admitting: Osteopathic Medicine

## 2017-08-22 ENCOUNTER — Encounter: Payer: Self-pay | Admitting: Osteopathic Medicine

## 2017-08-22 VITALS — BP 132/80 | HR 94 | Temp 98.2°F | Wt 346.1 lb

## 2017-08-22 DIAGNOSIS — Z6841 Body Mass Index (BMI) 40.0 and over, adult: Secondary | ICD-10-CM

## 2017-08-22 DIAGNOSIS — R7303 Prediabetes: Secondary | ICD-10-CM

## 2017-08-22 DIAGNOSIS — N529 Male erectile dysfunction, unspecified: Secondary | ICD-10-CM | POA: Diagnosis not present

## 2017-08-22 DIAGNOSIS — E291 Testicular hypofunction: Secondary | ICD-10-CM | POA: Diagnosis not present

## 2017-08-22 DIAGNOSIS — I10 Essential (primary) hypertension: Secondary | ICD-10-CM | POA: Diagnosis not present

## 2017-08-22 DIAGNOSIS — G4733 Obstructive sleep apnea (adult) (pediatric): Secondary | ICD-10-CM | POA: Diagnosis not present

## 2017-08-22 MED ORDER — TADALAFIL 10 MG PO TABS: 10.0000 mg | ORAL_TABLET | Freq: Every day | ORAL | 11 refills | Status: AC | PRN

## 2017-08-22 NOTE — Progress Notes (Signed)
HPI: Todd Cruz is a 46 y.o. male who  has a past medical history of Hypertension and Prediabetes (08/21/2017).  he presents to [facility] today, 08/22/17,  for chief complaint of: Hypertension Follow-up  Needs clearance letter for gastric bypass surgery ED medication concern   Planning for Roux-en-Y bypass later this spring, following with bariatrics, needs form letter completed. See scanned documents.   HTN: controlled on current meds, no CP/SOB, no HA/VC  ED: Cialis daily not working, also insurance only dispensing few pills a month instead of daily.      Past medical, surgical, social and family history reviewed:  Patient Active Problem List   Diagnosis Date Noted  . Prediabetes 08/21/2017  . Aortic systolic murmur on examination 08/16/2017  . Nocturia 08/16/2017  . Lower urinary tract symptoms (LUTS) 08/16/2017  . Family history of MI (myocardial infarction) 08/17/2016  . Family history of diabetes mellitus in first degree relative 08/17/2016  . Protein-calorie malnutrition (HCC) 07/01/2014  . 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 Erectile dysfunction 12/01/2012  . Testicular nodule 12/09/2011  . Obstructive sleep apnea syndrome in adult 12/02/2011  . Allergic rhinitis 11/10/2011  . Insomnia 11/10/2011  . Uncontrolled stage 2 hypertension 10/14/2011  . Obesity 10/14/2011  . Tobacco abuse 10/14/2011  . Hypogonadism in male 07/09/2008    Past Surgical History:  Procedure Laterality Date  . APPENDECTOMY    . LAPAROSCOPIC GASTRIC SLEEVE RESECTION      Social History   Tobacco Use  . Smoking status: Former Smoker    Years: 10.00    Types: Cigarettes    Last attempt to quit: 08/17/2017    Years since quitting: 0.0  . Smokeless tobacco: Never Used  Substance Use Topics  . Alcohol use: Yes    Comment: occasionally    Family History  Problem Relation Age of Onset  . Hypertension Mother   . Stroke  Mother   . Hypertension Father   . Diabetes Father   . Heart attack Father      Current medication list and allergy/intolerance information reviewed:    Current Outpatient Medications  Medication Sig Dispense Refill  . amLODipine (NORVASC) 10 MG tablet Take 1 tablet (10 mg total) by mouth daily. 30 tablet 1  . hydrochlorothiazide (HYDRODIURIL) 25 MG tablet Take 1 tablet (25 mg total) by mouth daily. 90 tablet 1  . tadalafil (CIALIS) 10 MG tablet Take 1-2 tablets (10-20 mg total) by mouth daily as needed for erectile dysfunction. 10 tablet 11   No current facility-administered medications for this visit.     Allergies  Allergen Reactions  . Lisinopril Other (See Comments) and Cough    Headache and cough Penile dysfuntion   . Pollen Extract Other (See Comments)    Stuffy nose      Review of Systems:  Constitutional:  No  fever, no chills, No recent illness  HEENT: No  headache, no vision change  Cardiac: No  chest pain, No  pressure, No palpitations  Respiratory:  No  shortness of breath. No  Cough  Gastrointestinal: No  abdominal pain, No  nausea, No  vomiting,  Musculoskeletal: No new myalgia/arthralgia  Skin: No  Rash  Endocrine: No cold intolerance,  No heat intolerance. No polyuria/polydipsia/polyphagia   Neurologic: No  weakness, No  dizziness  Psychiatric: No  concerns with depression, No  concerns with anxiety  Exam:  BP 132/80   Pulse 94   Temp 98.2 F (36.8 C) (Oral)  Wt (!) 346 lb 1.3 oz (157 kg)   BMI 54.20 kg/m   Constitutional: VS see above. General Appearance: alert, well-developed, well-nourished, NAD  Eyes: Normal lids and conjunctive, non-icteric sclera  Ears, Nose, Mouth, Throat: MMM, Normal external inspection ears/nares/mouth/lips/gums.   Neck: No masses, trachea midline. No thyroid enlargement. No tenderness/mass appreciated. No lymphadenopathy  Respiratory: Normal respiratory effort. no wheeze, no rhonchi, no  rales  Cardiovascular: S1/S2 normal, no murmur, no rub/gallop auscultated. RRR. No lower extremity edema.   Gastrointestinal: Nontender, no masses. No hepatomegaly, no splenomegaly.   Musculoskeletal: Gait normal. No clubbing/cyanosis of digits.   Neurological: Normal balance/coordination. No tremor.   Skin: warm, dry, intact.   Psychiatric: Normal judgment/insight. Normal mood and affect. Oriented x3.       ASSESSMENT/PLAN: The primary encounter diagnosis was BMI 50.0-59.9, adult (HCC). Diagnoses of Essential hypertension, Obstructive sleep apnea syndrome in adult, Hypogonadism in male, Prediabetes, and Erectile dysfunction, unspecified erectile dysfunction type were also pertinent to this visit.   Meds ordered this encounter  Medications  . tadalafil (CIALIS) 10 MG tablet    Sig: Take 1-2 tablets (10-20 mg total) by mouth daily as needed for erectile dysfunction.    Dispense:  10 tablet    Refill:  11     Visit summary with medication list and pertinent instructions was printed for patient to review. All questions at time of visit were answered - patient instructed to contact office with any additional concerns. ER/RTC precautions were reviewed with the patient.   Follow-up plan: Return for recheck sugars in 3 months .  Note: Total time spent 25 minutes, greater than 50% of the visit was spent face-to-face counseling and coordinating care for the following: The primary encounter diagnosis was BMI 50.0-59.9, adult (HCC). Diagnoses of Essential hypertension, Obstructive sleep apnea syndrome in adult, Hypogonadism in male, Prediabetes, and Erectile dysfunction, unspecified erectile dysfunction type were also pertinent to this visit.Marland Kitchen.  Please note: voice recognition software was used to produce this document, and typos may escape review. Please contact Dr. Lyn HollingsheadAlexander for any needed clarifications.

## 2017-08-23 ENCOUNTER — Encounter: Payer: Self-pay | Admitting: Osteopathic Medicine

## 2017-08-24 DIAGNOSIS — Z9884 Bariatric surgery status: Secondary | ICD-10-CM | POA: Diagnosis not present

## 2017-08-24 DIAGNOSIS — Z713 Dietary counseling and surveillance: Secondary | ICD-10-CM | POA: Diagnosis not present

## 2017-08-26 DIAGNOSIS — I10 Essential (primary) hypertension: Secondary | ICD-10-CM | POA: Diagnosis not present

## 2017-08-26 DIAGNOSIS — Z87891 Personal history of nicotine dependence: Secondary | ICD-10-CM | POA: Diagnosis not present

## 2017-08-26 DIAGNOSIS — E559 Vitamin D deficiency, unspecified: Secondary | ICD-10-CM | POA: Diagnosis not present

## 2017-08-26 DIAGNOSIS — R5382 Chronic fatigue, unspecified: Secondary | ICD-10-CM | POA: Diagnosis not present

## 2017-08-26 DIAGNOSIS — F54 Psychological and behavioral factors associated with disorders or diseases classified elsewhere: Secondary | ICD-10-CM | POA: Diagnosis not present

## 2017-08-26 DIAGNOSIS — E8881 Metabolic syndrome: Secondary | ICD-10-CM | POA: Diagnosis not present

## 2017-08-26 DIAGNOSIS — Z7189 Other specified counseling: Secondary | ICD-10-CM | POA: Diagnosis not present

## 2017-08-31 DIAGNOSIS — I10 Essential (primary) hypertension: Secondary | ICD-10-CM | POA: Diagnosis not present

## 2017-08-31 DIAGNOSIS — Z9884 Bariatric surgery status: Secondary | ICD-10-CM | POA: Diagnosis not present

## 2017-08-31 DIAGNOSIS — R112 Nausea with vomiting, unspecified: Secondary | ICD-10-CM | POA: Diagnosis not present

## 2017-09-10 ENCOUNTER — Encounter: Payer: Self-pay | Admitting: Emergency Medicine

## 2017-09-10 ENCOUNTER — Other Ambulatory Visit: Payer: Self-pay

## 2017-09-10 ENCOUNTER — Emergency Department (INDEPENDENT_AMBULATORY_CARE_PROVIDER_SITE_OTHER): Payer: BLUE CROSS/BLUE SHIELD

## 2017-09-10 ENCOUNTER — Emergency Department (INDEPENDENT_AMBULATORY_CARE_PROVIDER_SITE_OTHER)
Admission: EM | Admit: 2017-09-10 | Discharge: 2017-09-10 | Disposition: A | Discharge status: Home / Self Care | Payer: BLUE CROSS/BLUE SHIELD | Source: Home / Self Care | Attending: Family Medicine | Admitting: Family Medicine

## 2017-09-10 DIAGNOSIS — Z87891 Personal history of nicotine dependence: Secondary | ICD-10-CM | POA: Diagnosis not present

## 2017-09-10 DIAGNOSIS — I10 Essential (primary) hypertension: Secondary | ICD-10-CM | POA: Diagnosis not present

## 2017-09-10 DIAGNOSIS — Z9884 Bariatric surgery status: Secondary | ICD-10-CM | POA: Diagnosis not present

## 2017-09-10 DIAGNOSIS — M542 Cervicalgia: Secondary | ICD-10-CM

## 2017-09-10 DIAGNOSIS — M5031 Other cervical disc degeneration,  high cervical region: Secondary | ICD-10-CM | POA: Diagnosis not present

## 2017-09-10 DIAGNOSIS — Z888 Allergy status to other drugs, medicaments and biological substances status: Secondary | ICD-10-CM | POA: Diagnosis not present

## 2017-09-10 DIAGNOSIS — M19012 Primary osteoarthritis, left shoulder: Secondary | ICD-10-CM | POA: Diagnosis not present

## 2017-09-10 DIAGNOSIS — R0781 Pleurodynia: Secondary | ICD-10-CM | POA: Diagnosis not present

## 2017-09-10 DIAGNOSIS — M25512 Pain in left shoulder: Secondary | ICD-10-CM | POA: Diagnosis not present

## 2017-09-10 DIAGNOSIS — Z91048 Other nonmedicinal substance allergy status: Secondary | ICD-10-CM | POA: Diagnosis not present

## 2017-09-10 DIAGNOSIS — M7591 Shoulder lesion, unspecified, right shoulder: Secondary | ICD-10-CM | POA: Diagnosis not present

## 2017-09-10 DIAGNOSIS — Z79899 Other long term (current) drug therapy: Secondary | ICD-10-CM | POA: Diagnosis not present

## 2017-09-10 MED ORDER — PREDNISONE 20 MG PO TABS: ORAL_TABLET | ORAL | 0 refills | Status: DC

## 2017-09-10 MED ORDER — CYCLOBENZAPRINE HCL 10 MG PO TABS: 10.0000 mg | ORAL_TABLET | Freq: Every day | ORAL | 0 refills | Status: DC

## 2017-09-10 NOTE — Discharge Instructions (Signed)
Apply ice pack for 20 to 30 minutes, 3 to 4 times daily  Continue until pain and swelling decrease.  °

## 2017-09-10 NOTE — ED Triage Notes (Signed)
Patient reports pain on left neck area and right mid to low back for one week. Wonders if it happened when he pulled himself up into truck last week. Took Tylenol PM and oxycodone 3 tabs last night so he could sleep; no meds today and is declining offer now.

## 2017-09-10 NOTE — ED Provider Notes (Signed)
[facility]    CSN: [id number] Arrival date & time: 09/10/17  1602     History   Chief Complaint Chief Complaint  Patient presents with  . Back Pain  . Neck Pain    HPI Todd Cruz is a 47 y.o. male.   Patient complains of approximately one week history of pain in his left neck that seemed to begin after he pulled himself into his truck.  The neck pain is worse when he rotates his neck.  He has also developed intermittent pain in his right posterior/lateral chest, worse with inspiration and chest movement.  No shortness of breath.  No fevers, chills, and sweats.  He reports that he had a cold about two weeks ago, now resolved.     The history is provided by the patient.  Chest Pain  Pain location:  R lateral chest Pain quality: sharp   Pain radiates to:  Does not radiate Pain severity:  Mild Onset quality:  Gradual Duration:  1 week Timing:  Intermittent Progression:  Unchanged Chronicity:  New Context: breathing, lifting, movement and at rest   Relieved by:  None tried Worsened by:  Certain positions, coughing, deep breathing and movement Ineffective treatments:  None tried Associated symptoms: no abdominal pain, no back pain, no cough, no diaphoresis, no dysphagia, no fatigue, no fever, no heartburn, no lower extremity edema, no nausea, no orthopnea, no palpitations, no PND, no shortness of breath and no syncope   Risk factors: obesity     Past Medical History:  Diagnosis Date  . Hypertension   . Prediabetes 08/21/2017    Patient Active Problem List   Diagnosis Date Noted  . Prediabetes 08/21/2017  . Aortic systolic murmur on examination 08/16/2017  . Nocturia 08/16/2017  . Lower urinary tract symptoms (LUTS) 08/16/2017  . Family history of MI (myocardial infarction) 08/17/2016  . Family history of diabetes mellitus in first degree relative 08/17/2016  . Protein-calorie malnutrition (HCC) 07/01/2014  . Status post laparoscopic sleeve gastrectomy  06/28/2014  . Erectile dysfunction 12/01/2012  . Testicular nodule 12/09/2011  . Obstructive sleep apnea syndrome in adult 12/02/2011  . Allergic rhinitis 11/10/2011  . Insomnia 11/10/2011  . Uncontrolled stage 2 hypertension 10/14/2011  . Obesity 10/14/2011  . Tobacco abuse 10/14/2011  . Hypogonadism in male 07/09/2008    Past Surgical History:  Procedure Laterality Date  . APPENDECTOMY    . LAPAROSCOPIC GASTRIC SLEEVE RESECTION         Home Medications    Prior to Admission medications   Medication Sig Start Date End Date Taking? Authorizing Provider  amLODipine (NORVASC) 10 MG tablet Take 1 tablet (10 mg total) by mouth daily. 04/11/17   Sunnie Nielsen, DO  cyclobenzaprine (FLEXERIL) 10 MG tablet Take 1 tablet (10 mg total) by mouth at bedtime. 09/10/17   Lattie Haw, MD  hydrochlorothiazide (HYDRODIURIL) 25 MG tablet Take 1 tablet (25 mg total) by mouth daily. 04/11/17   Sunnie Nielsen, DO  predniSONE (DELTASONE) 20 MG tablet Take one tab by mouth twice daily for 5 days, then one daily for 3 days. Take with food. 09/10/17   Lattie Haw, MD  tadalafil (CIALIS) 10 MG tablet Take 1-2 tablets (10-20 mg total) by mouth daily as needed for erectile dysfunction. 08/22/17   Sunnie Nielsen, DO    Family History Family History  Problem Relation Age of Onset  . Hypertension Mother   . Stroke Mother   . Hypertension Father   . Diabetes Father   .  Heart attack Father     Social History Social History   Tobacco Use  . Smoking status: Former Smoker    Years: 10.00    Types: Cigarettes    Last attempt to quit: 08/17/2017    Years since quitting: 0.0  . Smokeless tobacco: Never Used  Substance Use Topics  . Alcohol use: Yes    Comment: occasionally  . Drug use: No     Allergies   Lisinopril and Pollen extract   Review of Systems Review of Systems  Constitutional: Negative for diaphoresis, fatigue and fever.  HENT: Negative for trouble swallowing.     Respiratory: Negative for cough and shortness of breath.   Cardiovascular: Positive for chest pain. Negative for palpitations, orthopnea, syncope and PND.  Gastrointestinal: Negative for abdominal pain, heartburn and nausea.  Musculoskeletal: Positive for neck pain and neck stiffness. Negative for back pain.     Physical Exam Triage Vital Signs ED Triage Vitals  Enc Vitals Group     BP 09/10/17 1646 135/85     Pulse Rate 09/10/17 1646 99     Resp 09/10/17 1646 16     Temp 09/10/17 1646 98.1 F (36.7 C)     Temp Source 09/10/17 1646 Oral     SpO2 09/10/17 1646 98 %     Weight 09/10/17 1647 (!) 340 lb (154.2 kg)     Height 09/10/17 1647 5' 7.5" (1.715 m)     Head Circumference --      Peak Flow --      Pain Score 09/10/17 1646 10     Pain Loc --      Pain Edu? --      Excl. in GC? --    No data found.  Updated Vital Signs BP 135/85 (BP Location: Right Arm)   Pulse 99   Temp 98.1 F (36.7 C) (Oral)   Resp 16   Ht 5' 7.5" (1.715 m)   Wt (!) 340 lb (154.2 kg)   SpO2 98%   BMI 52.47 kg/m   Visual Acuity Right Eye Distance:   Left Eye Distance:   Bilateral Distance:    Right Eye Near:   Left Eye Near:    Bilateral Near:     Physical Exam  Constitutional: He appears well-developed and well-nourished. No distress.  HENT:  Head: Normocephalic.  Right Ear: External ear normal.  Left Ear: External ear normal.  Nose: Nose normal.  Mouth/Throat: Oropharynx is clear and moist.  Eyes: Conjunctivae are normal. Pupils are equal, round, and reactive to light.  Neck: Neck supple. Muscular tenderness present. Normal range of motion present.    There is left occipital, sternocleidomastoid, and trapezius tenderness to palpation as noted on diagram.   Cardiovascular: Normal heart sounds.  Pulmonary/Chest: Breath sounds normal. No respiratory distress.     He exhibits tenderness. He exhibits no crepitus, no edema and no swelling.  There is right posterior/lateral chest  and rib tenderness to palpation as noted on diagram.     Abdominal: There is no tenderness.  Musculoskeletal: He exhibits edema. He exhibits no tenderness.  Lymphadenopathy:    He has no cervical adenopathy.  Neurological: He is alert.  Skin: Skin is warm and dry. No rash noted.  Nursing note and vitals reviewed.    UC Treatments / Results  Labs (all labs ordered are listed, but only abnormal results are displayed) Labs Reviewed - No data to display  EKG  EKG Interpretation None  Radiology Dg Ribs Unilateral W/chest Right  Result Date: 09/10/2017 CLINICAL DATA:  Right rib pain for 1 week without known injury. EXAM: RIGHT RIBS AND CHEST - 3+ VIEW COMPARISON:  None. FINDINGS: No fracture or other bone lesions are seen involving the ribs. There is no evidence of pneumothorax or pleural effusion. Both lungs are clear. Heart size and mediastinal contours are within normal limits. IMPRESSION: Normal right ribs.  No acute cardiopulmonary abnormality seen. Electronically Signed   By: Lupita RaiderJames  Green Jr, M.D.   On: 09/10/2017 18:41   Dg Cervical Spine Complete  Result Date: 09/10/2017 CLINICAL DATA:  Acute left-sided neck pain without injury. EXAM: CERVICAL SPINE - COMPLETE 4+ VIEW COMPARISON:  CT scan of September 01, 2014. FINDINGS: No fracture or spondylolisthesis is noted. Mild degenerative disc disease is noted at C3-4 and C4-5 and C6-7 with anterior osteophyte formation. No definite neural foraminal stenosis is noted. IMPRESSION: Multilevel degenerative disc disease. No acute abnormality seen in the cervical spine. Electronically Signed   By: Lupita RaiderJames  Green Jr, M.D.   On: 09/10/2017 18:39    Procedures Procedures (including critical care time)  Medications Ordered in UC Medications - No data to display   Initial Impression / Assessment and Plan / UC Course  I have reviewed the triage vital signs and the nursing notes.  Pertinent labs & imaging results that were available during  my care of the patient were reviewed by me and considered in my medical decision making (see chart for details).    Suspect cervical radiculopathy. Rib pain may represent intercostal muscle sprain resulting from recent URI. Begin prednisone burst/taper.  Flexeril 10mg  HS. Apply ice pack for 20 to 30 minutes, 3 to 4 times daily  Continue until pain and swelling decrease.  Followup with Dr. Rodney Langtonhomas Thekkekandam or Dr. Clementeen GrahamEvan Corey ([facility]) for further evaluation.    Final Clinical Impressions(s) / UC Diagnoses   Final diagnoses:  Neck pain  Rib pain on right side    ED Discharge Orders        Ordered    predniSONE (DELTASONE) 20 MG tablet     09/10/17 1901    cyclobenzaprine (FLEXERIL) 10 MG tablet  Daily at bedtime     09/10/17 1901          Lattie HawBeese, Verenise Moulin A, MD 09/17/17 1404

## 2017-09-21 DIAGNOSIS — Z6841 Body Mass Index (BMI) 40.0 and over, adult: Secondary | ICD-10-CM | POA: Diagnosis not present

## 2017-09-21 DIAGNOSIS — Z713 Dietary counseling and surveillance: Secondary | ICD-10-CM | POA: Diagnosis not present

## 2017-09-21 DIAGNOSIS — I10 Essential (primary) hypertension: Secondary | ICD-10-CM | POA: Diagnosis not present

## 2017-09-30 DIAGNOSIS — Z9884 Bariatric surgery status: Secondary | ICD-10-CM | POA: Diagnosis not present

## 2017-09-30 DIAGNOSIS — I10 Essential (primary) hypertension: Secondary | ICD-10-CM | POA: Diagnosis not present

## 2017-09-30 DIAGNOSIS — Z6841 Body Mass Index (BMI) 40.0 and over, adult: Secondary | ICD-10-CM | POA: Diagnosis not present

## 2017-10-05 DIAGNOSIS — Z713 Dietary counseling and surveillance: Secondary | ICD-10-CM | POA: Diagnosis not present

## 2017-10-15 ENCOUNTER — Emergency Department (INDEPENDENT_AMBULATORY_CARE_PROVIDER_SITE_OTHER)
Admission: EM | Admit: 2017-10-15 | Discharge: 2017-10-15 | Disposition: A | Discharge status: Home / Self Care | Payer: BLUE CROSS/BLUE SHIELD | Source: Home / Self Care | Attending: Family Medicine | Admitting: Family Medicine

## 2017-10-15 ENCOUNTER — Encounter: Payer: Self-pay | Admitting: Emergency Medicine

## 2017-10-15 DIAGNOSIS — R319 Hematuria, unspecified: Secondary | ICD-10-CM | POA: Diagnosis not present

## 2017-10-15 DIAGNOSIS — N390 Urinary tract infection, site not specified: Secondary | ICD-10-CM

## 2017-10-15 LAB — POCT URINALYSIS DIP (MANUAL ENTRY)
Glucose, UA: NEGATIVE mg/dL
NITRITE UA: POSITIVE — AB
PH UA: 7 (ref 5.0–8.0)
Protein Ur, POC: >=300 mg/dL — AB
Spec Grav, UA: 1.025 (ref 1.010–1.025)
Urobilinogen, UA: 4 E.U./dL — AB

## 2017-10-15 MED ORDER — CIPROFLOXACIN HCL 500 MG PO TABS: 500.0000 mg | ORAL_TABLET | Freq: Two times a day (BID) | ORAL | 0 refills | Status: DC

## 2017-10-15 NOTE — Discharge Instructions (Addendum)
Increase fluid intake. May use non-prescription AZO for about two days, if desired, to decrease urinary discomfort.  If symptoms become significantly worse during the night or over the weekend, proceed to the local emergency room.  

## 2017-10-15 NOTE — ED Triage Notes (Signed)
Patient presents to [facility] with C/O pain in penis area, blood in urine, and dysuria times 3 days. Patient also states that he had a DOP physical 2 months ago and at that time he also had blood in his urine. Denies STD issues.

## 2017-10-15 NOTE — ED Provider Notes (Signed)
[facility]    CSN: [id number] Arrival date & time: 10/15/17  [id number]     History   Chief Complaint Chief Complaint  Patient presents with  . Hematuria    HPI Todd Cruz is a 47 y.o. male.   Patient complains of four day history of dysuria and urgency, and during the past two days he has noted small amounts of blood in his urine.  No abdominal, pelvic, or flank pain.  No fevers, chills, and sweats.  No concern for STD.  He states that during a [employer] physical exam two months ago he was noted to have hematuria.  The history is provided by the patient.  Dysuria  This is a new problem. Episode onset: 4 days ago. The problem occurs constantly. The problem has been gradually worsening. Pertinent negatives include no abdominal pain. Nothing aggravates the symptoms. Nothing relieves the symptoms. He has tried nothing for the symptoms.    Past Medical History:  Diagnosis Date  . Hypertension   . Prediabetes 08/21/2017    Patient Active Problem List   Diagnosis Date Noted  . Prediabetes 08/21/2017  . Aortic systolic murmur on examination 08/16/2017  . Nocturia 08/16/2017  . Lower urinary tract symptoms (LUTS) 08/16/2017  . Family history of MI (myocardial infarction) 08/17/2016  . Family history of diabetes mellitus in first degree relative 08/17/2016  . Protein-calorie malnutrition (HCC) 07/01/2014  . 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 Erectile dysfunction 12/01/2012  . Testicular nodule 12/09/2011  . Obstructive sleep apnea syndrome in adult 12/02/2011  . Allergic rhinitis 11/10/2011  . Insomnia 11/10/2011  . Uncontrolled stage 2 hypertension 10/14/2011  . Obesity 10/14/2011  . Tobacco abuse 10/14/2011  . Hypogonadism in male 07/09/2008    Past Surgical History:  Procedure Laterality Date  . APPENDECTOMY    . LAPAROSCOPIC GASTRIC SLEEVE RESECTION         Home Medications    Prior to Admission medications   Medication Sig Start  Date End Date Taking? Authorizing Provider  amLODipine (NORVASC) 10 MG tablet Take 1 tablet (10 mg total) by mouth daily. 04/11/17   Sunnie NielsenAlexander, Natalie, DO  ciprofloxacin (CIPRO) 500 MG tablet Take 1 tablet (500 mg total) by mouth 2 (two) times daily. 10/15/17   Lattie HawBeese, Stephen A, MD  cyclobenzaprine (FLEXERIL) 10 MG tablet Take 1 tablet (10 mg total) by mouth at bedtime. 09/10/17   Lattie HawBeese, Stephen A, MD  hydrochlorothiazide (HYDRODIURIL) 25 MG tablet Take 1 tablet (25 mg total) by mouth daily. 04/11/17   Sunnie NielsenAlexander, Natalie, DO  predniSONE (DELTASONE) 20 MG tablet Take one tab by mouth twice daily for 5 days, then one daily for 3 days. Take with food. 09/10/17   Lattie HawBeese, Stephen A, MD  tadalafil (CIALIS) 10 MG tablet Take 1-2 tablets (10-20 mg total) by mouth daily as needed for erectile dysfunction. 08/22/17   Sunnie NielsenAlexander, Natalie, DO    Family History Family History  Problem Relation Age of Onset  . Hypertension Mother   . Stroke Mother   . Hypertension Father   . Diabetes Father   . Heart attack Father     Social History Social History   Tobacco Use  . Smoking status: Former Smoker    Years: 10.00    Types: Cigarettes    Last attempt to quit: 08/17/2017    Years since quitting: 0.1  . Smokeless tobacco: Never Used  Substance Use Topics  . Alcohol use: Yes    Comment: occasionally  . Drug  use: No     Allergies   Lisinopril and Pollen extract   Review of Systems Review of Systems  Constitutional: Negative for chills, diaphoresis, fatigue and fever.  Gastrointestinal: Negative for abdominal pain.  Genitourinary: Positive for dysuria, frequency, hematuria and urgency. Negative for discharge, flank pain, genital sores, penile pain, penile swelling, scrotal swelling and testicular pain.  All other systems reviewed and are negative.    Physical Exam Triage Vital Signs ED Triage Vitals  Enc Vitals Group     BP 10/15/17 1046 (!) 152/94     Pulse Rate 10/15/17 1046 84     Resp 10/15/17  1046 18     Temp 10/15/17 1046 98.6 F (37 C)     Temp Source 10/15/17 1046 Oral     SpO2 10/15/17 1046 95 %     Weight 10/15/17 1047 (!) 340 lb (154.2 kg)     Height 10/15/17 1047 5\' 8"  (1.727 m)     Head Circumference --      Peak Flow --      Pain Score 10/15/17 1047 8     Pain Loc --      Pain Edu? --      Excl. in GC? --    No data found.  Updated Vital Signs BP (!) 152/94 (BP Location: Right Arm)   Pulse 84   Temp 98.6 F (37 C) (Oral)   Resp 18   Ht 5\' 8"  (1.727 m)   Wt (!) 340 lb (154.2 kg)   SpO2 95%   BMI 51.70 kg/m   Visual Acuity Right Eye Distance:   Left Eye Distance:   Bilateral Distance:    Right Eye Near:   Left Eye Near:    Bilateral Near:     Physical Exam Nursing notes and Vital Signs reviewed. Appearance:  Patient appears stated age, and in no acute distress.    Eyes:  Pupils are equal, round, and reactive to light and accomodation.  Extraocular movement is intact.  Conjunctivae are not inflamed   Pharynx:  Normal; moist mucous membranes  Neck:  Supple.  No adenopathy Lungs:  Clear to auscultation.  Breath sounds are equal.  Moving air well. Heart:  Regular rate and rhythm without murmurs, rubs, or gallops.  Abdomen:  Nontender without masses or hepatosplenomegaly.  Bowel sounds are present.  No CVA or flank tenderness.  Extremities:  No edema.  Skin:  No rash present.     UC Treatments / Results  Labs (all labs ordered are listed, but only abnormal results are displayed) Labs Reviewed  POCT URINALYSIS DIP (MANUAL ENTRY) - Abnormal; Notable for the following components:      Result Value   Clarity, UA cloudy (*)    Bilirubin, UA small (*)    Ketones, POC UA trace (5) (*)    Blood, UA large (*)    Protein Ur, POC >=300 (*)    Urobilinogen, UA 4.0 (*)    Nitrite, UA Positive (*)    Leukocytes, UA Large (3+) (*)    All other components within normal limits  URINE CULTURE    EKG None Radiology No results  found.  Procedures Procedures (including critical care time)  Medications Ordered in UC Medications - No data to display   Initial Impression / Assessment and Plan / UC Course  I have reviewed the triage vital signs and the nursing notes.  Pertinent labs & imaging results that were available during my care of the patient were  reviewed by me and considered in my medical decision making (see chart for details).    Urine culture pending. Begin Cipro 500mg  BID for 10 days. Increase fluid intake. May use non-prescription AZO for about two days, if desired, to decrease urinary discomfort.  If symptoms become significantly worse during the night or over the weekend, proceed to the local emergency room.  Followup with Urologist.   Final Clinical Impressions(s) / UC Diagnoses   Final diagnoses:  Urinary tract infection with hematuria, site unspecified    ED Discharge Orders        Ordered    ciprofloxacin (CIPRO) 500 MG tablet  2 times daily     10/15/17 1119         Lattie Haw, MD 10/20/17 1926

## 2017-10-17 ENCOUNTER — Telehealth: Payer: Self-pay | Admitting: Emergency Medicine

## 2017-10-17 LAB — URINE CULTURE
MICRO NUMBER: 90427809
SPECIMEN QUALITY:: ADEQUATE

## 2017-11-05 ENCOUNTER — Other Ambulatory Visit: Payer: Self-pay | Admitting: Osteopathic Medicine

## 2017-11-05 DIAGNOSIS — I10 Essential (primary) hypertension: Secondary | ICD-10-CM

## 2017-11-09 ENCOUNTER — Emergency Department
Admission: EM | Admit: 2017-11-09 | Discharge: 2017-11-09 | Disposition: A | Discharge status: Home / Self Care | Payer: BLUE CROSS/BLUE SHIELD | Source: Home / Self Care

## 2017-11-09 ENCOUNTER — Emergency Department (INDEPENDENT_AMBULATORY_CARE_PROVIDER_SITE_OTHER): Payer: BLUE CROSS/BLUE SHIELD

## 2017-11-09 ENCOUNTER — Other Ambulatory Visit: Payer: Self-pay

## 2017-11-09 ENCOUNTER — Encounter: Payer: Self-pay | Admitting: Emergency Medicine

## 2017-11-09 ENCOUNTER — Encounter: Payer: Self-pay | Admitting: Osteopathic Medicine

## 2017-11-09 DIAGNOSIS — R079 Chest pain, unspecified: Secondary | ICD-10-CM

## 2017-11-09 DIAGNOSIS — Z888 Allergy status to other drugs, medicaments and biological substances status: Secondary | ICD-10-CM | POA: Diagnosis not present

## 2017-11-09 DIAGNOSIS — Z79899 Other long term (current) drug therapy: Secondary | ICD-10-CM | POA: Diagnosis not present

## 2017-11-09 DIAGNOSIS — I10 Essential (primary) hypertension: Secondary | ICD-10-CM | POA: Diagnosis not present

## 2017-11-09 DIAGNOSIS — K449 Diaphragmatic hernia without obstruction or gangrene: Secondary | ICD-10-CM | POA: Diagnosis not present

## 2017-11-09 DIAGNOSIS — R9431 Abnormal electrocardiogram [ECG] [EKG]: Secondary | ICD-10-CM | POA: Diagnosis not present

## 2017-11-09 DIAGNOSIS — E041 Nontoxic single thyroid nodule: Secondary | ICD-10-CM | POA: Diagnosis not present

## 2017-11-09 DIAGNOSIS — R0789 Other chest pain: Secondary | ICD-10-CM | POA: Diagnosis not present

## 2017-11-09 DIAGNOSIS — Z87891 Personal history of nicotine dependence: Secondary | ICD-10-CM | POA: Diagnosis not present

## 2017-11-09 DIAGNOSIS — M7989 Other specified soft tissue disorders: Secondary | ICD-10-CM

## 2017-11-09 NOTE — ED Provider Notes (Signed)
[facility]    CSN: [id number] Arrival date & time: 11/09/17  4098     History   Chief Complaint Chief Complaint  Patient presents with  . Chest Pain    HPI Todd Cruz is a 47 y.o. male.  Patient had the onset 2 weeks ago of a throbbing type discomfort in his right anterior chest.  Approximately 5 days ago this pain became increasingly severe.  He has difficulty taking a full breath.  He has a bubbly sensation in his right chest.  He also has discomfort in the right side of his neck and with turning of his neck.  He has discomfort with movement of the right arm or when trying to take a full breath.  He denies any left-sided discomfort. HPI  Past Medical History:  Diagnosis Date  . Hypertension   . Prediabetes 08/21/2017    Patient Active Problem List   Diagnosis Date Noted  . Prediabetes 08/21/2017  . Aortic systolic murmur on examination 08/16/2017  . Nocturia 08/16/2017  . Lower urinary tract symptoms (LUTS) 08/16/2017  . Family history of MI (myocardial infarction) 08/17/2016  . Family history of diabetes mellitus in first degree relative 08/17/2016  . Protein-calorie malnutrition (HCC) 07/01/2014  . 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 Erectile dysfunction 12/01/2012  . Testicular nodule 12/09/2011  . Obstructive sleep apnea syndrome in adult 12/02/2011  . Allergic rhinitis 11/10/2011  . Insomnia 11/10/2011  . Uncontrolled stage 2 hypertension 10/14/2011  . Obesity 10/14/2011  . Tobacco abuse 10/14/2011  . Hypogonadism in male 07/09/2008    Past Surgical History:  Procedure Laterality Date  . APPENDECTOMY    . LAPAROSCOPIC GASTRIC SLEEVE RESECTION         Home Medications    Prior to Admission medications   Medication Sig Start Date End Date Taking? Authorizing Provider  amLODipine (NORVASC) 10 MG tablet Take 1 tablet (10 mg total) by mouth daily. 04/11/17  Yes Sunnie Nielsen, DO  hydrochlorothiazide (HYDRODIURIL)  25 MG tablet Take by mouth. 03/17/17  Yes [provider]  tadalafil (CIALIS) 10 MG tablet Take 1-2 tablets (10-20 mg total) by mouth daily as needed for erectile dysfunction. 08/22/17   Sunnie Nielsen, DO    Family History Family History  Problem Relation Age of Onset  . Hypertension Mother   . Stroke Mother   . Diabetes Mother   . Hypertension Father   . Diabetes Father   . Heart attack Father     Social History Social History   Tobacco Use  . Smoking status: Current Every Day Smoker    Packs/day: 0.50    Years: 10.00    Pack years: 5.00    Types: Cigarettes  . Smokeless tobacco: Never Used  Substance Use Topics  . Alcohol use: Yes    Comment: 2 q wk  . Drug use: No     Allergies   Lisinopril and Pollen extract   Review of Systems Review of Systems  Constitutional: Negative for fever.  HENT: Negative.   Respiratory:       He has discomfort when he tries to take a full breath.  He also has sleep apnea and uses a CPAP machine.  Cardiovascular: Positive for chest pain and leg swelling. Negative for palpitations.       He has a history of hypertension.  Gastrointestinal:       Of note he has had a gastric sleeve procedure.  Endocrine: Negative.   Genitourinary:  He recently was treated for urinary tract infection with Cipro.     Physical Exam Triage Vital Signs ED Triage Vitals  Enc Vitals Group     BP 11/09/17 1010 (!) 168/96     Pulse Rate 11/09/17 1010 83     Resp 11/09/17 1010 18     Temp 11/09/17 1010 98.7 F (37.1 C)     Temp Source 11/09/17 1010 Oral     SpO2 11/09/17 1010 99 %     Weight 11/09/17 1011 (!) 348 lb (157.9 kg)     Height 11/09/17 1011  (1.702 m)     Head Circumference --      Peak Flow --      Pain Score 11/09/17 1011 10     Pain Loc --      Pain Edu? --      Excl. in GC? --    No data found.  Updated Vital Signs BP (!) 168/96 (BP Location: Right Arm)   Pulse 83   Temp 98.7 F (37.1 C) (Oral)   Resp  18   Ht  (1.702 m)   Wt (!) 348 lb (157.9 kg)   SpO2 99%   BMI 54.50 kg/m   Visual Acuity Right Eye Distance:   Left Eye Distance:   Bilateral Distance:    Right Eye Near:   Left Eye Near:    Bilateral Near:     Physical Exam  Constitutional:  Overweight male with obvious discomfort with movement from lying to sitting or taking a deep breath  HENT:  Head: Normocephalic.  Eyes: Pupils are equal, round, and reactive to light.  Neck:  There is discomfort with turning of his neck to right to left.  Cardiovascular: Normal rate and regular rhythm.  Murmur heard.  Systolic murmur is present with a grade of 2/6. Pulmonary/Chest:  Patient has difficulty with taking a full breath .  There is no tenderness to palpation over the right chest wall.  I did not hear a definite rub on the right side.  Left-sided breath sounds were clear.  Abdominal: There is no tenderness.  Musculoskeletal:       Right lower leg: He exhibits edema. He exhibits no tenderness.       Left lower leg: He exhibits edema. He exhibits no tenderness.  Skin: Skin is warm and dry.     UC Treatments / Results  Labs (all labs ordered are listed, but only abnormal results are displayed) Labs Reviewed - No data to display  EKG None  Radiology Dg Chest 2 View  Result Date: 11/09/2017 CLINICAL DATA:  Severe right pleuritic chest pain EXAM: CHEST - 2 VIEW COMPARISON:  09/10/2017 FINDINGS: Heart and mediastinal contours are within normal limits. No focal opacities or effusions. No acute bony abnormality. IMPRESSION: No active cardiopulmonary disease. Electronically Signed   By: Charlett Nose M.D.   On: 11/09/2017 10:45    Procedures Procedures (including critical care time)  Medications Ordered in UC Medications - No data to display  Initial Impression / Assessment and Plan / UC Course  I have reviewed the triage vital signs and the nursing notes.  Pertinent labs & imaging results that were available  during my care of the patient were reviewed by me and considered in my medical decision making (see chart for details).   History is concerning for potential pulmonary embolus.  He is overweight with bilateral calf swelling and works as a [employer].  His pulse ox  was normal.  We will proceed with chest x-ray if negative will send to the hospital for evaluation for potential CT angiogram.  Chest x-ray is negative patient referred to [facility] emergency room in [place] for further evaluation .   Final Clinical Impressions(s) / UC Diagnoses   Final diagnoses:  Right-sided chest pain  Leg swelling     Discharge Instructions     Please go to the [facility] emergency room in [place]. I have spoken with the charge nurse about your medical problems.    ED Prescriptions    None     Controlled Substance Prescriptions  Controlled Substance Registry consulted? Not Applicable   Collene Gobble, MD 11/09/17 1110

## 2017-11-09 NOTE — Discharge Instructions (Addendum)
Please go to the [facility] emergency room in [place]. I have spoken with the charge nurse about your medical problems.

## 2017-11-09 NOTE — ED Triage Notes (Signed)
Pt c/o RT sided CP x 2 wks, worse x 5 days.denies injury. Reports he was driving and changing gears at onset.denies recent URI.

## 2017-11-11 ENCOUNTER — Telehealth: Payer: Self-pay

## 2017-11-11 NOTE — Telephone Encounter (Signed)
Left msg for pt asking how he was feeling after his visit to [facility] ED. Advised pt to call should he have any additional questions or concerns.

## 2017-11-11 NOTE — Telephone Encounter (Signed)
Patient returning our follow up call; states did go to ER where they did CT and found no cause for his chest pain; sent him home with rx for pain medication; he will follow with Dr.Alexander.

## 2017-11-17 ENCOUNTER — Other Ambulatory Visit: Payer: Self-pay | Admitting: Osteopathic Medicine

## 2017-11-17 DIAGNOSIS — I10 Essential (primary) hypertension: Secondary | ICD-10-CM

## 2018-01-13 ENCOUNTER — Other Ambulatory Visit: Payer: Self-pay | Admitting: Osteopathic Medicine

## 2018-01-31 ENCOUNTER — Encounter: Payer: Self-pay | Admitting: *Deleted

## 2018-01-31 ENCOUNTER — Other Ambulatory Visit: Payer: Self-pay

## 2018-01-31 ENCOUNTER — Emergency Department (INDEPENDENT_AMBULATORY_CARE_PROVIDER_SITE_OTHER)
Admission: EM | Admit: 2018-01-31 | Discharge: 2018-01-31 | Disposition: A | Discharge status: Home / Self Care | Payer: BLUE CROSS/BLUE SHIELD | Source: Home / Self Care | Attending: Family Medicine | Admitting: Family Medicine

## 2018-01-31 DIAGNOSIS — L739 Follicular disorder, unspecified: Secondary | ICD-10-CM

## 2018-01-31 DIAGNOSIS — I10 Essential (primary) hypertension: Secondary | ICD-10-CM

## 2018-01-31 MED ORDER — CEPHALEXIN 500 MG PO CAPS: 500.0000 mg | ORAL_CAPSULE | Freq: Three times a day (TID) | ORAL | 0 refills | Status: AC

## 2018-01-31 MED ORDER — MUPIROCIN 2 % EX OINT
TOPICAL_OINTMENT | CUTANEOUS | 0 refills | Status: AC
Start: 2018-01-31 — End: ?

## 2018-01-31 NOTE — ED Triage Notes (Signed)
Pt c/o rash on his abd x 2-3 days. Denies itching or pain.

## 2018-01-31 NOTE — ED Provider Notes (Signed)
[facility]    CSN: [id number] Arrival date & time: 01/31/18  7253     History   Chief Complaint Chief Complaint  Patient presents with  . Rash    HPI Todd Cruz is a 47 y.o. male.   HPI  Todd Cruz is a 47 y.o. male presenting to UC with c/o a rash that he noticed on his lower abdomen about 2 days ago. Denies pain or itching.  He has not tried any medication for the rash because he does not know what caused it. No new soaps, lotions or medications. Pt is a Naval architect. No recent exposure to plants outside.  Pt's BP in triage: 191/133. Hx of HTN. He does not take his BP medication routinely because pt states "my penis doesn't work when I take it."  He did not take his BP medication today.  He reports mild HA but states that is from a tooth pain. Denies dizziness, change in vision, chest pain or SOB.   Past Medical History:  Diagnosis Date  . Hypertension   . Prediabetes 08/21/2017    Patient Active Problem List   Diagnosis Date Noted  . Thyroid nodule 11/09/2017  . Prediabetes 08/21/2017  . Aortic systolic murmur on examination 08/16/2017  . Nocturia 08/16/2017  . Lower urinary tract symptoms (LUTS) 08/16/2017  . Family history of MI (myocardial infarction) 08/17/2016  . Family history of diabetes mellitus in first degree relative 08/17/2016  . Protein-calorie malnutrition (HCC) 07/01/2014  . 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 Erectile dysfunction 12/01/2012  . Testicular nodule 12/09/2011  . Obstructive sleep apnea syndrome in adult 12/02/2011  . Allergic rhinitis 11/10/2011  . Insomnia 11/10/2011  . Uncontrolled stage 2 hypertension 10/14/2011  . Obesity 10/14/2011  . Tobacco abuse 10/14/2011  . Hypogonadism in male 07/09/2008    Past Surgical History:  Procedure Laterality Date  . APPENDECTOMY    . LAPAROSCOPIC GASTRIC SLEEVE RESECTION         Home Medications    Prior to Admission medications   Medication  Sig Start Date End Date Taking? Authorizing Provider  amLODipine (NORVASC) 10 MG tablet Take 1 tablet (10 mg total) by mouth daily. Pt needs f/u appt w/PCP for refills. 11/18/17   Sunnie Nielsen, DO  BELVIQ XR 20 MG TB24  12/16/17   [provider]  cephALEXin (KEFLEX) 500 MG capsule Take 1 capsule (500 mg total) by mouth 3 (three) times daily for 7 days. 01/31/18 02/07/18  Lurene Shadow, PA-C  hydrochlorothiazide (HYDRODIURIL) 25 MG tablet Take 1 tablet (25 mg total) by mouth daily. LAST REFILL.MUST SCHEDULE AND KEEP APPOINTMENT FOR REFILLS. 01/16/18   Sunnie Nielsen, DO  mupirocin ointment (BACTROBAN) 2 % Apply to rash 3 times daily for 7 days 01/31/18   Lurene Shadow, PA-C  tadalafil (CIALIS) 10 MG tablet Take 1-2 tablets (10-20 mg total) by mouth daily as needed for erectile dysfunction. 08/22/17   Sunnie Nielsen, DO    Family History Family History  Problem Relation Age of Onset  . Hypertension Mother   . Stroke Mother   . Diabetes Mother   . Hypertension Father   . Diabetes Father   . Heart attack Father     Social History Social History   Tobacco Use  . Smoking status: Current Every Day Smoker    Packs/day: 0.50    Years: 10.00    Pack years: 5.00    Types: Cigarettes  . Smokeless tobacco: Never Used  Substance Use Topics  . Alcohol use: Yes    Comment: 2 q wk  . Drug use: No     Allergies   Lisinopril and Pollen extract   Review of Systems Review of Systems  Cardiovascular: Negative for chest pain, palpitations and leg swelling.  Skin: Positive for rash. Negative for wound.  Neurological: Positive for headaches. Negative for dizziness and light-headedness.     Physical Exam Triage Vital Signs ED Triage Vitals  Enc Vitals Group     BP 01/31/18 0957 (!) 191/133     Pulse Rate 01/31/18 0957 74     Resp 01/31/18 0957 18     Temp 01/31/18 0957 98.3 F (36.8 C)     Temp Source 01/31/18 0957 Oral     SpO2 01/31/18 0957 98 %     Weight  01/31/18 0958 (!) 347 lb (157.4 kg)     Height 01/31/18 0958 5\' 8"  (1.727 m)     Head Circumference --      Peak Flow --      Pain Score 01/31/18 0958 0     Pain Loc --      Pain Edu? --      Excl. in GC? --    No data found.  Updated Vital Signs BP (!) 181/100 (BP Location: Right Arm)   Pulse 74   Temp 98.3 F (36.8 C) (Oral)   Resp 18   Ht 5\' 8"  (1.727 m)   Wt (!) 347 lb (157.4 kg)   SpO2 98%   BMI 52.76 kg/m   Visual Acuity Right Eye Distance:   Left Eye Distance:   Bilateral Distance:    Right Eye Near:   Left Eye Near:    Bilateral Near:     Physical Exam  Constitutional: He is oriented to person, place, and time. He appears well-developed and well-nourished. No distress.  Obese male sitting in exam chair, fidgety but alert and cooperative during exam. NAD  HENT:  Head: Normocephalic and atraumatic.  Eyes: EOM are normal.  Neck: Normal range of motion.  Cardiovascular: Normal rate and regular rhythm.  Pulmonary/Chest: Effort normal and breath sounds normal. No stridor. No respiratory distress. He has no wheezes. He has no rales.  Musculoskeletal: Normal range of motion.  Neurological: He is alert and oriented to person, place, and time.  Skin: Skin is warm and dry. He is not diaphoretic. There is erythema.  Abdomen: several diffuse 1-372mm pustules coming from hair follicles on abdomen. Non-tender. No active bleeding or drainage.   Psychiatric: He has a normal mood and affect. His behavior is normal.  Nursing note and vitals reviewed.    UC Treatments / Results  Labs (all labs ordered are listed, but only abnormal results are displayed) Labs Reviewed - No data to display  EKG None  Radiology No results found.  Procedures Procedures (including critical care time)  Medications Ordered in UC Medications - No data to display  Initial Impression / Assessment and Plan / UC Course  I have reviewed the triage vital signs and the nursing  notes.  Pertinent labs & imaging results that were available during my care of the patient were reviewed by me and considered in my medical decision making (see chart for details).     Hx and exam c/w folliculitis  Discussed concern for pt's uncontrolled HTN. On recheck BP improved to 181/100 (down from 191/133)  Home care instructions provided below.   Final Clinical Impressions(s) / UC Diagnoses  Final diagnoses:  Folliculitis  Uncontrolled hypertension     Discharge Instructions      Your rash should heal within about 1 week after using the antibiotics.  If not improving, please follow up with your family doctor for a recheck.  Your blood pressure today was dangerously high. It is extremely important to take your blood pressure medication as prescribed.  When the top number (systolic) is close to 200 and the bottom number (diastolic) is higher than 110, it puts you at a high risk of stroke, kidney failure, heart disease, and even death. If you have unwanted side effects from your blood pressure medication, please discuss with your family doctor to see if your medications change be changed or adjusted.     ED Prescriptions    Medication Sig Dispense Auth. Provider   cephALEXin (KEFLEX) 500 MG capsule Take 1 capsule (500 mg total) by mouth 3 (three) times daily for 7 days. 21 capsule Waylan Rocher O, PA-C   mupirocin ointment (BACTROBAN) 2 % Apply to rash 3 times daily for 7 days 30 g Lurene Shadow, [place]     Controlled Substance Prescriptions [place] Controlled Substance Registry consulted? Not Applicable   Rolla Plate 01/31/18 1052

## 2018-01-31 NOTE — Discharge Instructions (Signed)
°  Your rash should heal within about 1 week after using the antibiotics.  If not improving, please follow up with your family doctor for a recheck.  Your blood pressure today was dangerously high. It is extremely important to take your blood pressure medication as prescribed.  When the top number (systolic) is close to 200 and the bottom number (diastolic) is higher than 110, it puts you at a high risk of stroke, kidney failure, heart disease, and even death. If you have unwanted side effects from your blood pressure medication, please discuss with your family doctor to see if your medications change be changed or adjusted.

## 2018-03-03 DIAGNOSIS — Z6841 Body Mass Index (BMI) 40.0 and over, adult: Secondary | ICD-10-CM | POA: Diagnosis not present

## 2018-03-03 DIAGNOSIS — Z9884 Bariatric surgery status: Secondary | ICD-10-CM | POA: Diagnosis not present

## 2018-03-03 DIAGNOSIS — I10 Essential (primary) hypertension: Secondary | ICD-10-CM | POA: Diagnosis not present

## 2018-05-05 DIAGNOSIS — Z888 Allergy status to other drugs, medicaments and biological substances status: Secondary | ICD-10-CM | POA: Diagnosis not present

## 2018-05-05 DIAGNOSIS — Z79899 Other long term (current) drug therapy: Secondary | ICD-10-CM | POA: Diagnosis not present

## 2018-05-05 DIAGNOSIS — Z9109 Other allergy status, other than to drugs and biological substances: Secondary | ICD-10-CM | POA: Diagnosis not present

## 2018-05-05 DIAGNOSIS — R51 Headache: Secondary | ICD-10-CM | POA: Diagnosis not present

## 2018-05-05 DIAGNOSIS — I10 Essential (primary) hypertension: Secondary | ICD-10-CM | POA: Diagnosis not present

## 2018-05-05 DIAGNOSIS — R9431 Abnormal electrocardiogram [ECG] [EKG]: Secondary | ICD-10-CM | POA: Diagnosis not present

## 2018-05-05 DIAGNOSIS — F17210 Nicotine dependence, cigarettes, uncomplicated: Secondary | ICD-10-CM | POA: Diagnosis not present

## 2018-05-06 ENCOUNTER — Other Ambulatory Visit: Payer: Self-pay | Admitting: Osteopathic Medicine

## 2018-05-06 DIAGNOSIS — I10 Essential (primary) hypertension: Secondary | ICD-10-CM

## 2019-12-16 IMAGING — DX DG RIBS W/ CHEST 3+V*R*
4 series · 4 of 4 positions shown · non-contrast
Comparison: None.

CLINICAL DATA: Right rib pain for 1 week without known injury.

EXAM:
RIGHT RIBS AND CHEST - 3+ VIEW

[chest pa]
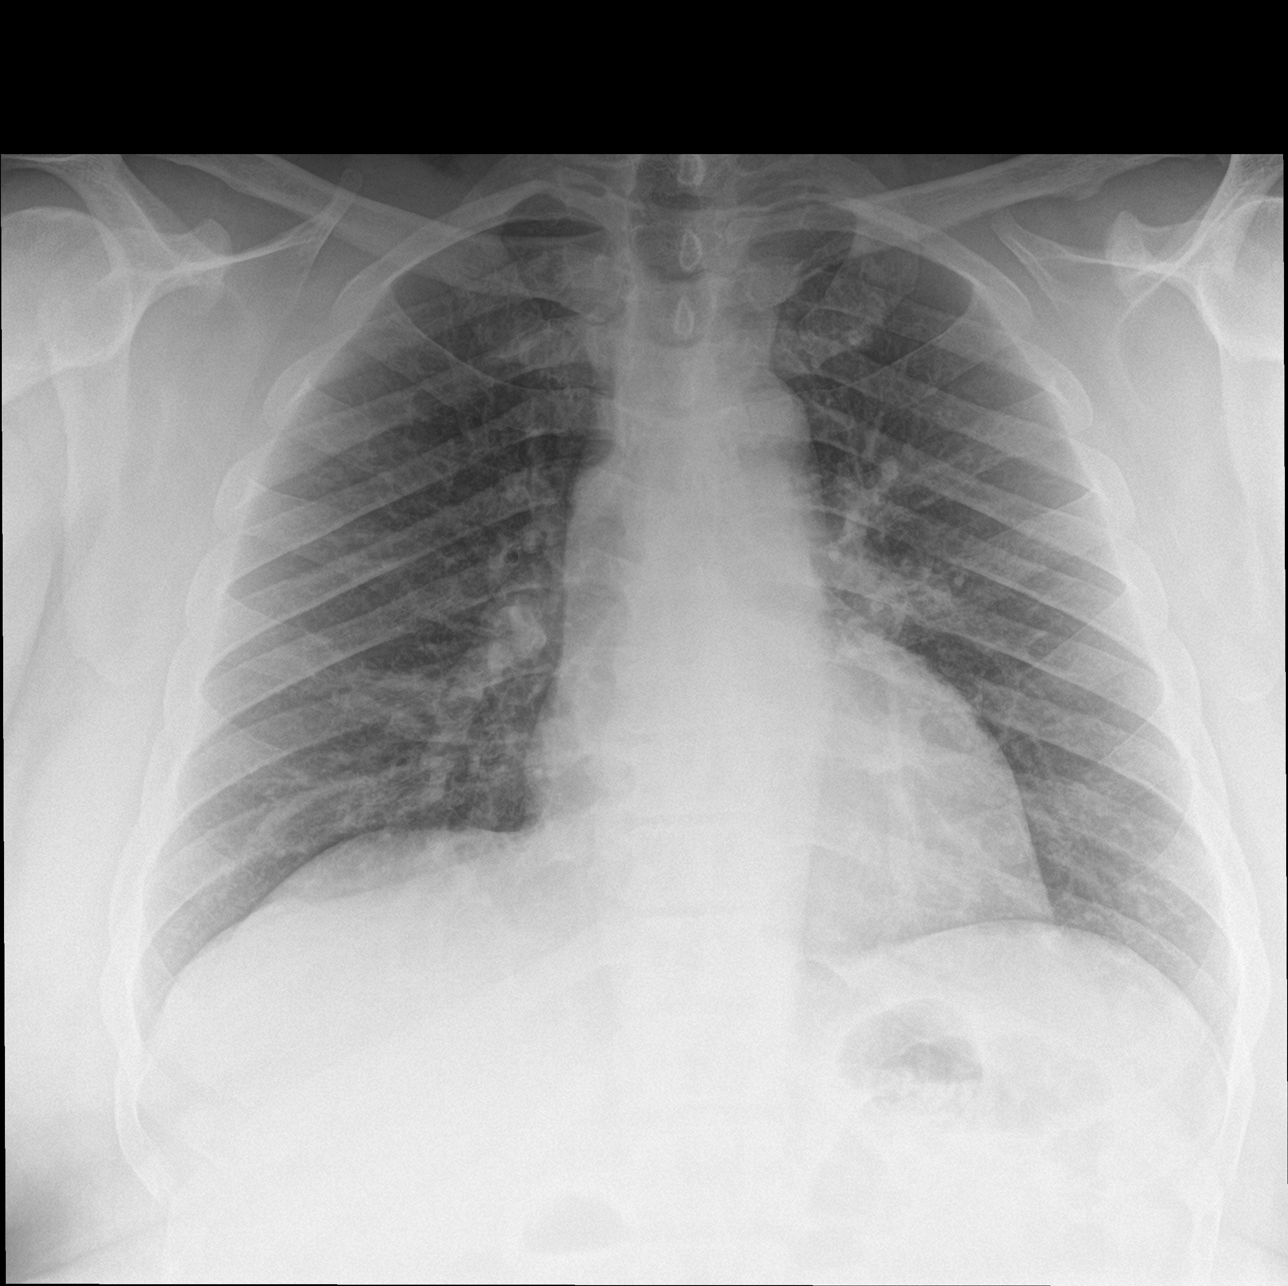

[rib ap (1 of 2)]
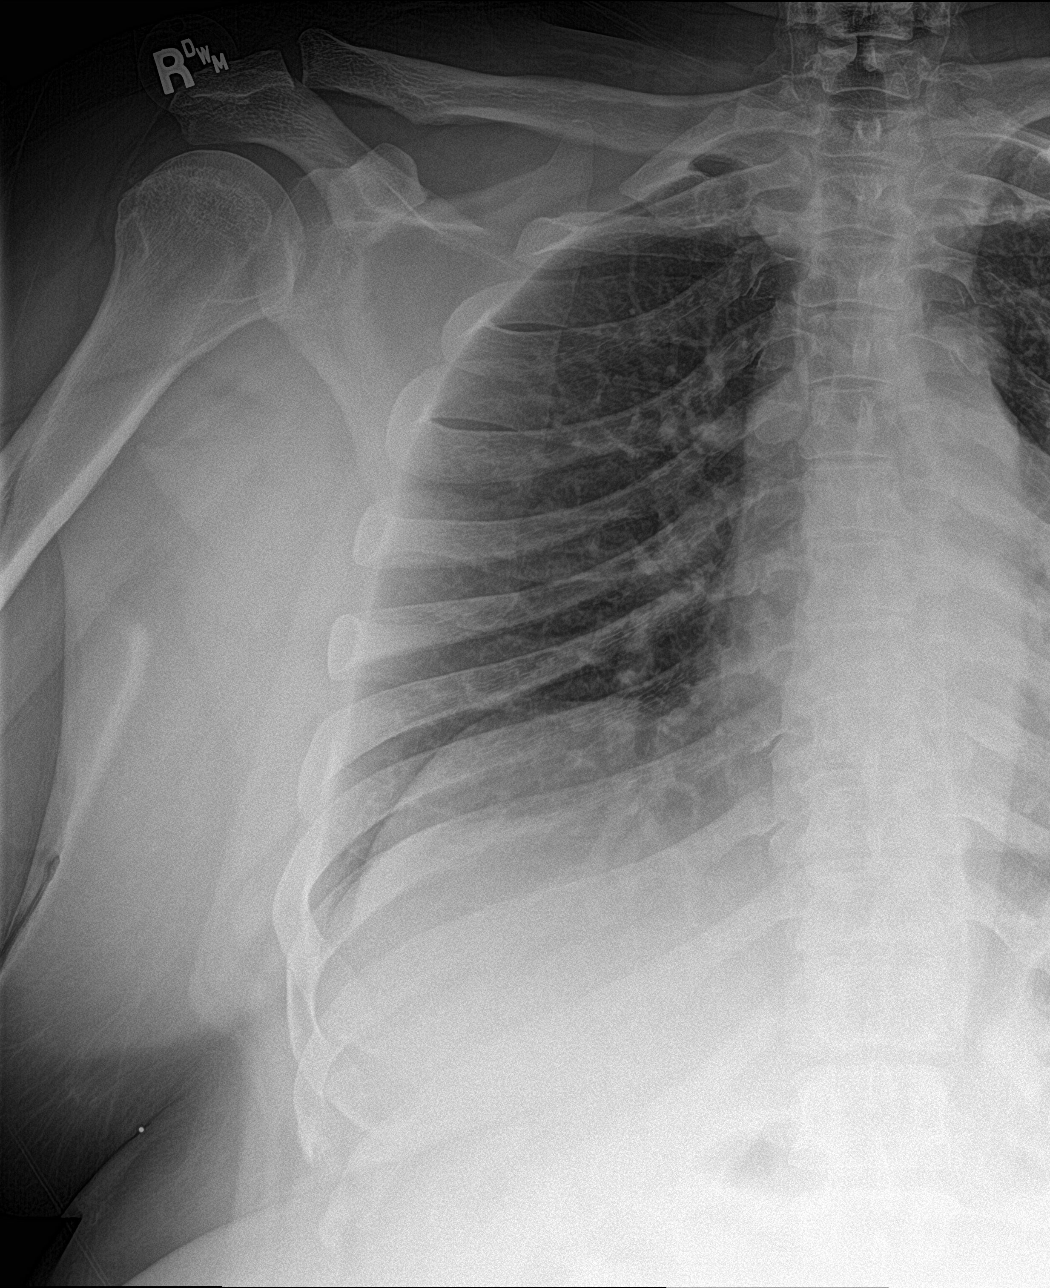

[rib ap (2 of 2)]
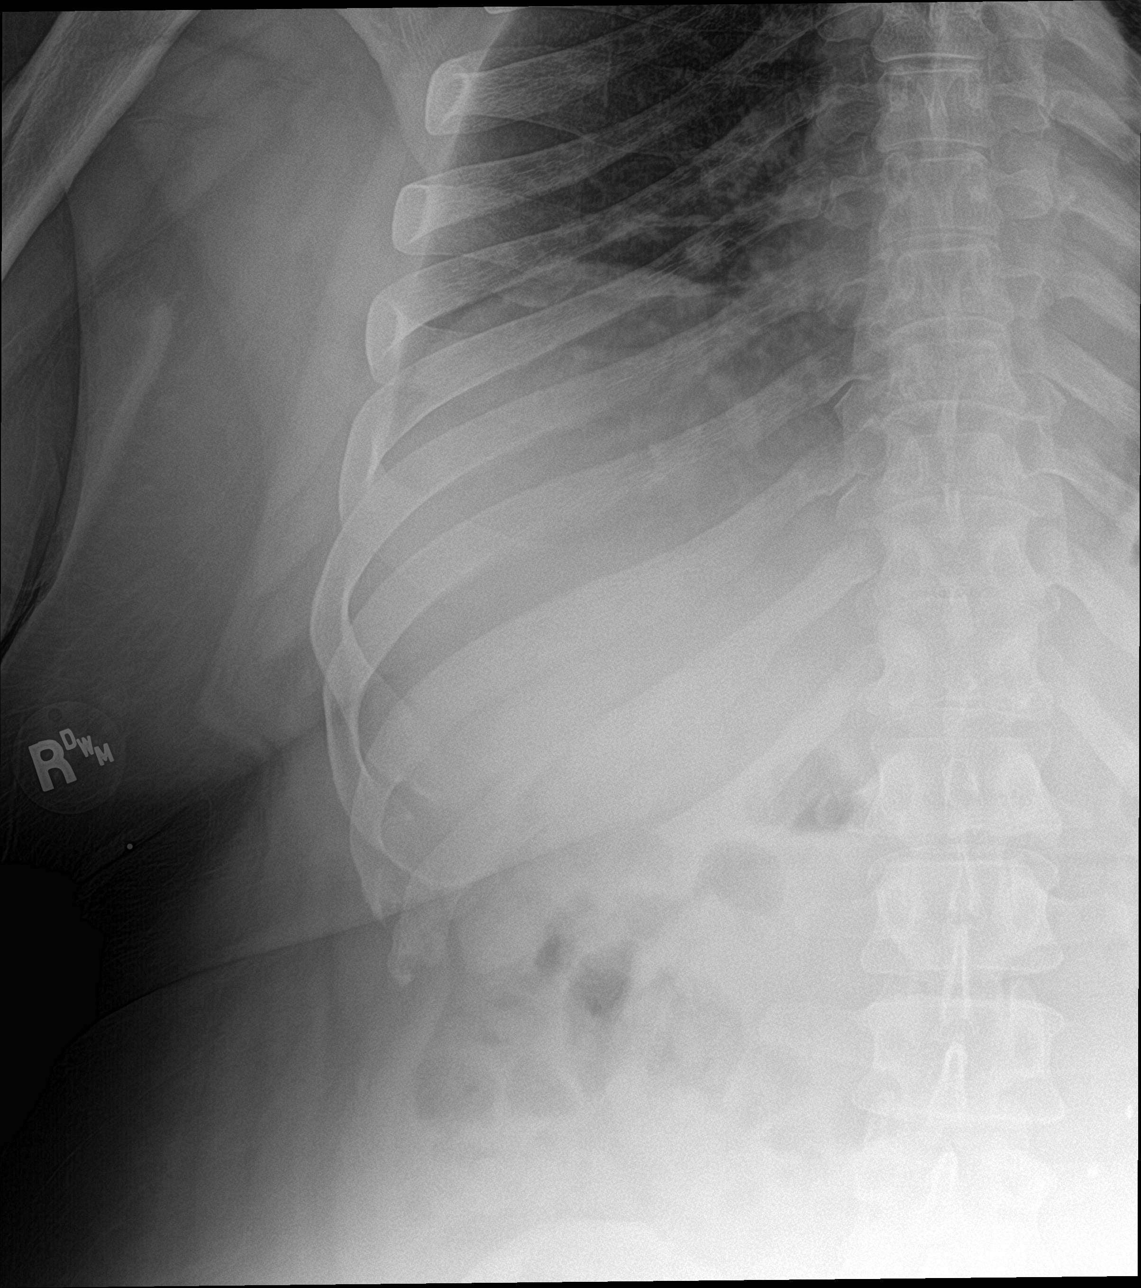

[rib ap obl]
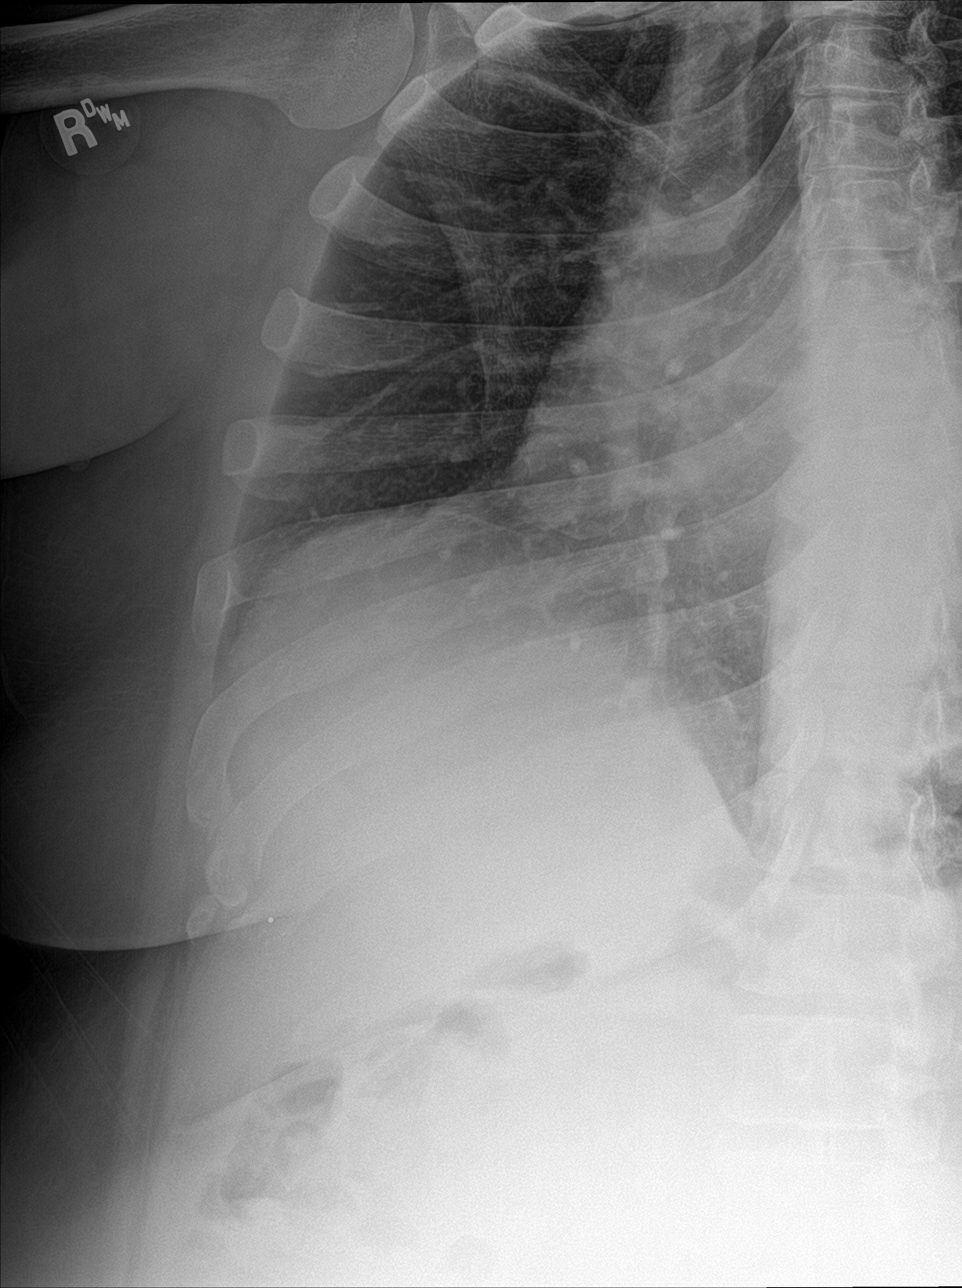

[4 of 4 positions shown; findings below may reference images not displayed]

FINDINGS: No fracture or other bone lesions are seen involving the ribs. There
is no evidence of pneumothorax or pleural effusion. Both lungs are
clear. Heart size and mediastinal contours are within normal limits.
IMPRESSION: Normal right ribs.  No acute cardiopulmonary abnormality seen.

## 2019-12-16 IMAGING — DX DG CERVICAL SPINE COMPLETE 4+V
6 series · 6 of 6 positions shown · non-contrast
Comparison: CT scan of September 01, 2014.

CLINICAL DATA: Acute left-sided neck pain without injury.

EXAM:
CERVICAL SPINE - COMPLETE 4+ VIEW

[c-spine lat]
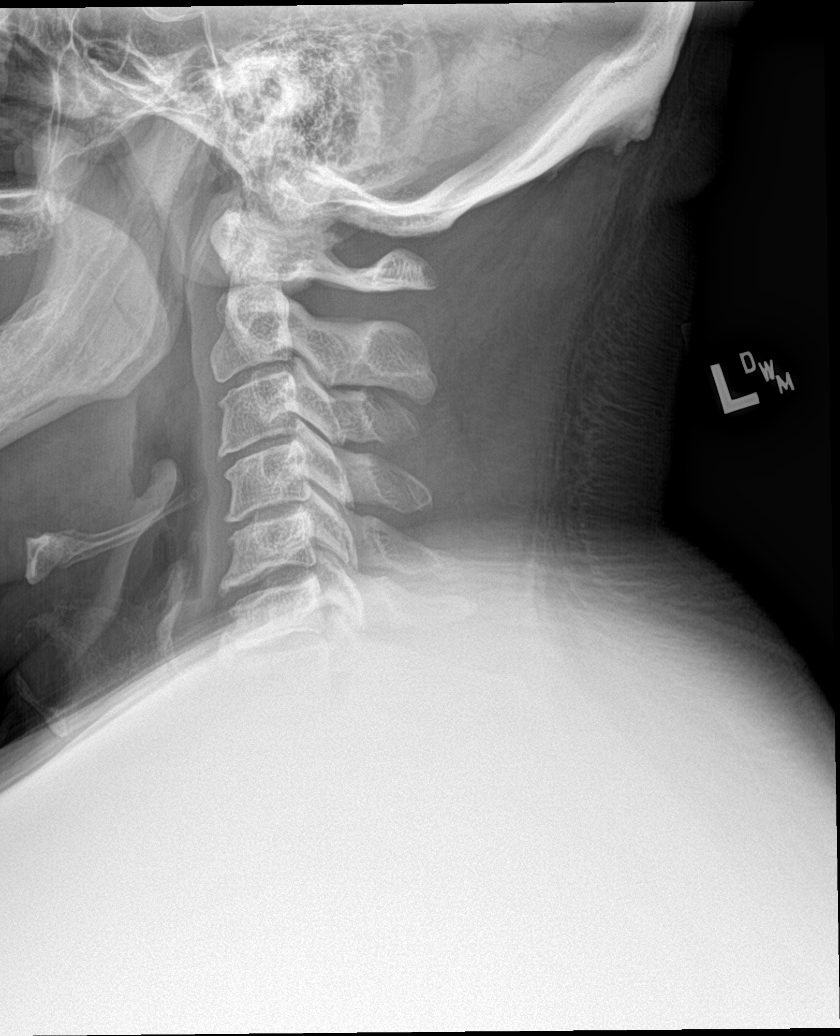

[c-spine obl (1 of 2)]
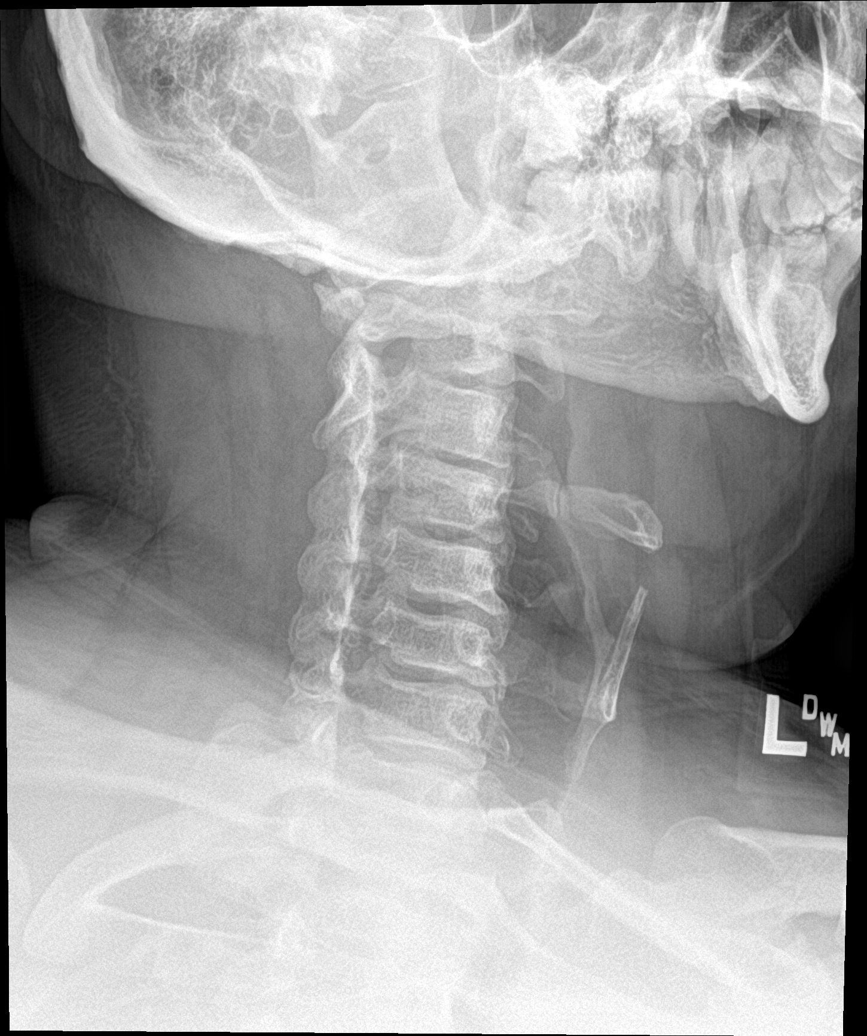

[c-spine obl (2 of 2)]
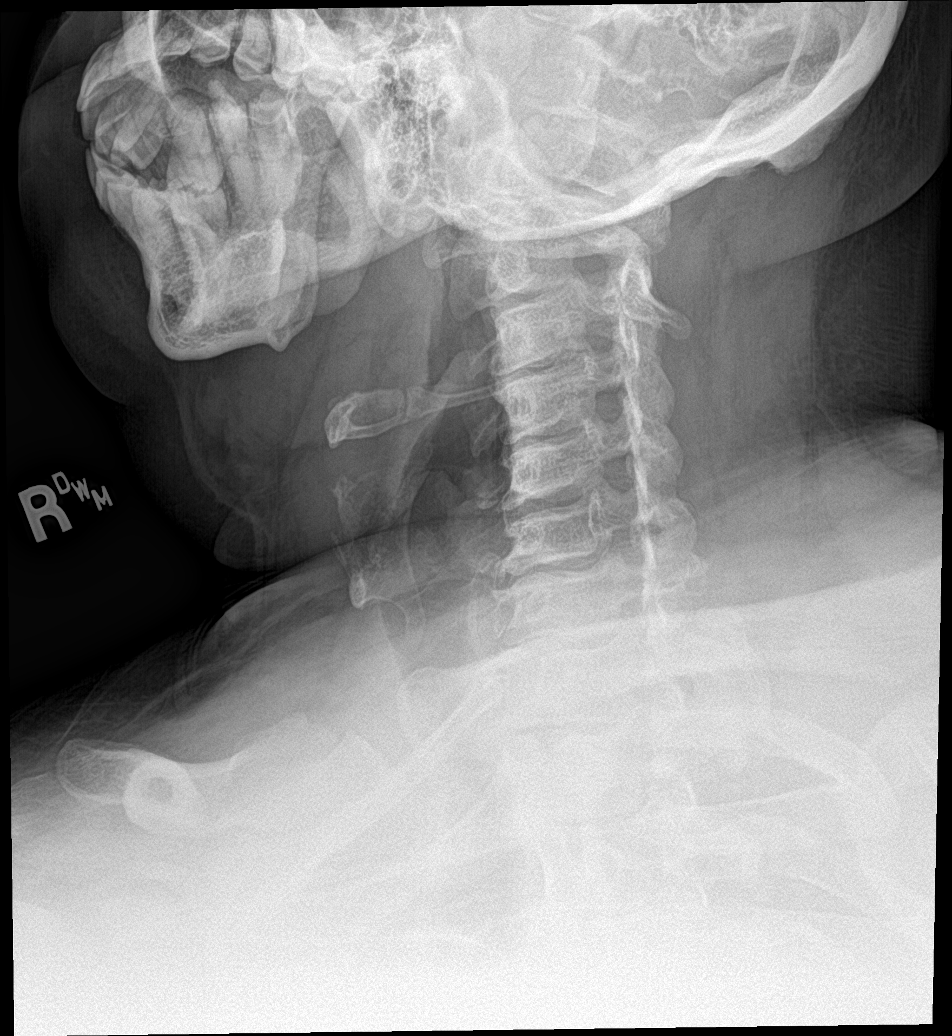

[c-spine ap]
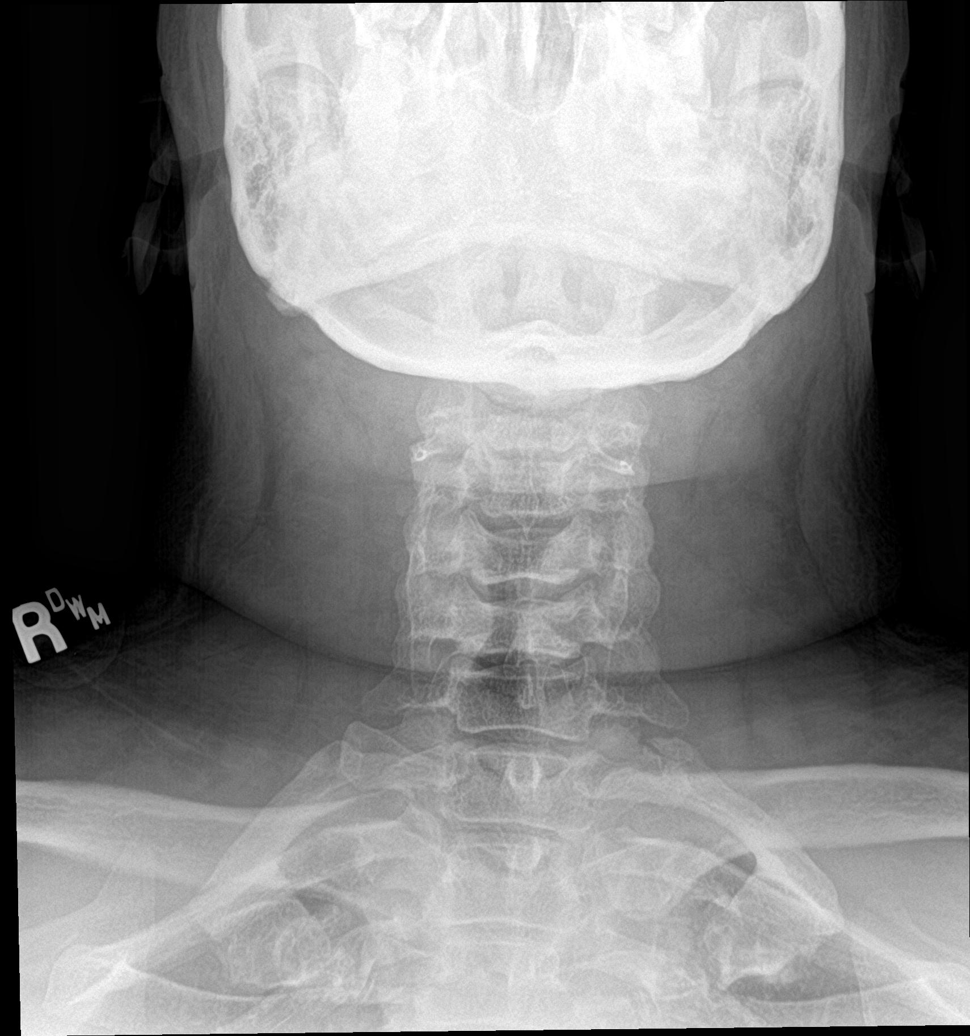

[c-spine open mouth]
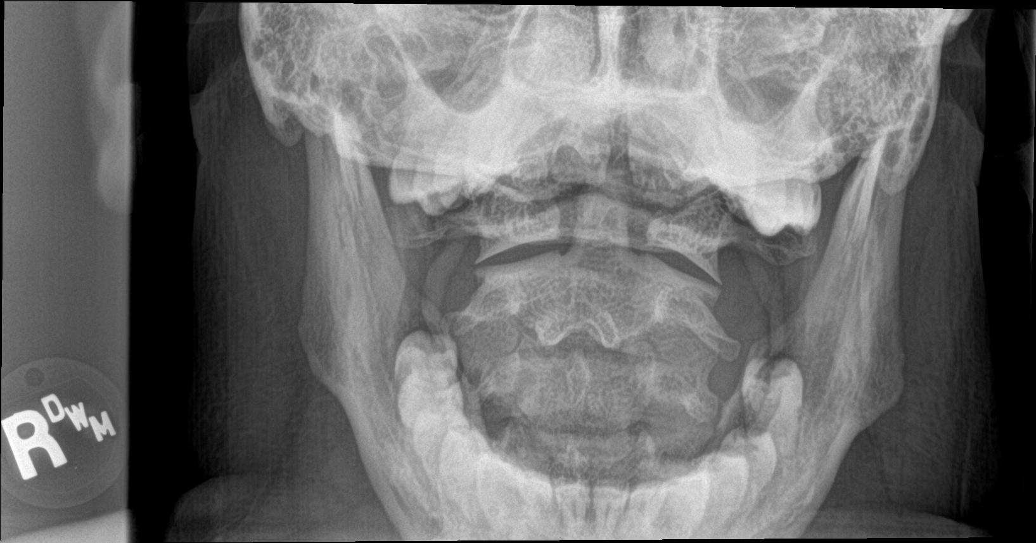

[c-spine swimmers]
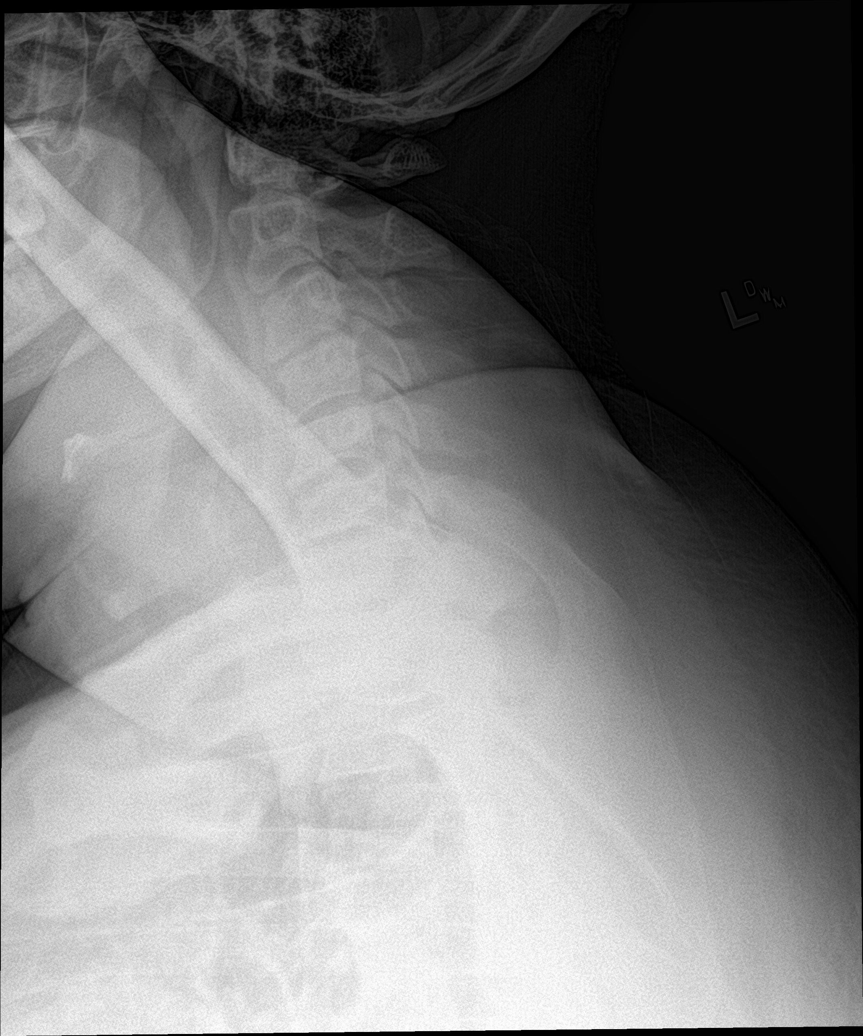

[6 of 6 positions shown; findings below may reference images not displayed]

FINDINGS: No fracture or spondylolisthesis is noted. Mild degenerative disc
disease is noted at C3-4 and C4-5 and C6-7 with anterior osteophyte
formation. No definite neural foraminal stenosis is noted.
IMPRESSION: Multilevel degenerative disc disease. No acute abnormality seen in
the cervical spine.

## 2020-11-17 ENCOUNTER — Other Ambulatory Visit: Payer: Self-pay

## 2020-11-17 ENCOUNTER — Emergency Department (INDEPENDENT_AMBULATORY_CARE_PROVIDER_SITE_OTHER): Payer: BLUE CROSS/BLUE SHIELD

## 2020-11-17 ENCOUNTER — Emergency Department (INDEPENDENT_AMBULATORY_CARE_PROVIDER_SITE_OTHER)
Admission: RE | Admit: 2020-11-17 | Discharge: 2020-11-17 | Disposition: A | Discharge status: Home / Self Care | Payer: BLUE CROSS/BLUE SHIELD | Source: Ambulatory Visit

## 2020-11-17 VITALS — BP 212/112 | HR 87

## 2020-11-17 DIAGNOSIS — M25562 Pain in left knee: Secondary | ICD-10-CM | POA: Diagnosis not present

## 2020-11-17 DIAGNOSIS — M79641 Pain in right hand: Secondary | ICD-10-CM

## 2020-11-17 MED ORDER — DICLOFENAC SODIUM 75 MG PO TBEC: 75.0000 mg | DELAYED_RELEASE_TABLET | Freq: Two times a day (BID) | ORAL | 0 refills | Status: AC

## 2020-11-17 NOTE — Discharge Instructions (Addendum)
Advise/strongly encourage patient to take blood pressure medication consistently on daily basis.  We will start 2-week trial of Diclofenac 75 mg.  We have advised/encouraged patient to follow-up with orthopedic provider for further evaluation of left knee pain and right hand pain.  Work note provided per patient's request.

## 2020-11-17 NOTE — ED Provider Notes (Signed)
[name]    CSN: [id number] Arrival date & time: 11/17/20  1205      History   Chief Complaint Chief Complaint  Patient presents with  . Appointment    Right hand and left knee pain    HPI Todd Cruz is a 50 y.o. male.   HPI 50 year old male presents with right hand pain and left knee pain for 2 weeks.  Patient presents today with BP of 212/112.  Patient reports not taking his previously prescribed blood pressure medications for 2 to 3 weeks.  Denies headache, changes in visual acuity, lightheadedness or dizziness.  Denies anginal equivalents, shortness of breath, claudication, lower extremity edema, presyncopal or syncopal episodes.  Patient reports trying Goody powder with no relief for either right hand or left knee pain for the past 2 weeks.  Chart review reveals patient met with orthopedic provider on 11/05/2020 [facility]) regarding chronic left knee pain.  Patient requests work note for today and tomorrow.  Past Medical History:  Diagnosis Date  . Hypertension   . Prediabetes 08/21/2017    Patient Active Problem List   Diagnosis Date Noted  . Thyroid nodule 11/09/2017  . Prediabetes 08/21/2017  . Aortic systolic murmur on examination 08/16/2017  . Nocturia 08/16/2017  . Lower urinary tract symptoms (LUTS) 08/16/2017  . Family history of MI (myocardial infarction) 08/17/2016  . Family history of diabetes mellitus in first degree relative 08/17/2016  . Protein-calorie malnutrition ([place]) 07/01/2014  . 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 Erectile dysfunction 12/01/2012  . Testicular nodule 12/09/2011  . Obstructive sleep apnea syndrome in adult 12/02/2011  . Allergic rhinitis 11/10/2011  . Insomnia 11/10/2011  . Uncontrolled stage 2 hypertension 10/14/2011  . Obesity 10/14/2011  . Tobacco abuse 10/14/2011  . Hypogonadism in male 07/09/2008    Past Surgical History:  Procedure Laterality Date  . APPENDECTOMY    . LAPAROSCOPIC  GASTRIC SLEEVE RESECTION         Home Medications    Prior to Admission medications   Medication Sig Start Date End Date Taking? Authorizing Provider  diclofenac (VOLTAREN) 75 MG EC tablet Take 1 tablet (75 mg total) by mouth 2 (two) times daily for 10 days. 11/17/20 11/27/20 Yes Eliezer Lofts, FNP  hydrochlorothiazide (HYDRODIURIL) 25 MG tablet Take 1 tablet (25 mg total) by mouth daily. LAST REFILL.MUST SCHEDULE AND KEEP APPOINTMENT FOR REFILLS. 01/16/18  Yes Emeterio Reeve, DO  tadalafil (CIALIS) 10 MG tablet Take 1-2 tablets (10-20 mg total) by mouth daily as needed for erectile dysfunction. 08/22/17  Yes Emeterio Reeve, DO  amLODipine (NORVASC) 10 MG tablet Take 1 tablet (10 mg total) by mouth daily. Pt needs f/u appt w/PCP for refills. 11/18/17   Emeterio Reeve, DO  BELVIQ XR 20 MG TB24  12/16/17   [provider]  mupirocin ointment (BACTROBAN) 2 % Apply to rash 3 times daily for 7 days 01/31/18   Noe Gens, PA-C    Family History Family History  Problem Relation Age of Onset  . Hypertension Mother   . Stroke Mother   . Diabetes Mother   . Hypertension Father   . Diabetes Father   . Heart attack Father     Social History Social History   Tobacco Use  . Smoking status: Current Every Day Smoker    Packs/day: 0.50    Years: 10.00    Pack years: 5.00    Types: Cigarettes  . Smokeless tobacco: Never Used  Vaping Use  .  Vaping Use: Never used  Substance Use Topics  . Alcohol use: Yes    Comment: 2 q wk  . Drug use: No     Allergies   Lisinopril and Pollen extract   Review of Systems Review of Systems  Constitutional: Negative.   HENT: Negative.   Eyes: Negative.   Respiratory: Negative.   Cardiovascular: Negative.   Gastrointestinal: Negative.   Genitourinary: Negative.   Musculoskeletal: Positive for arthralgias, myalgias and neck stiffness.       Right hand pain and left knee pain for 2 weeks.  Skin: Negative.   Neurological:  Negative.      Physical Exam Triage Vital Signs ED Triage Vitals  Enc Vitals Group     BP 11/17/20 1220 (!) 212/112     Pulse Rate 11/17/20 1220 87     Resp --      Temp --      Temp src --      SpO2 11/17/20 1220 97 %     Weight --      Height --      Head Circumference --      Peak Flow --      Pain Score 11/17/20 1222 7     Pain Loc --      Pain Edu? --      Excl. in GC? --    No data found.  Updated Vital Signs BP (!) 212/112 (BP Location: Right Arm)   Pulse 87   SpO2 97%      Physical Exam Constitutional:      Appearance: Normal appearance. He is obese.  HENT:     Head: Normocephalic and atraumatic.  Eyes:     Extraocular Movements: Extraocular movements intact.     Pupils: Pupils are equal, round, and reactive to light.  Neck:     Vascular: No carotid bruit.  Cardiovascular:     Rate and Rhythm: Normal rate and regular rhythm.     Pulses: Normal pulses.     Heart sounds: Normal heart sounds.     Comments: Hypertensive Pulmonary:     Effort: Pulmonary effort is normal.     Breath sounds: Normal breath sounds.  Musculoskeletal:     Cervical back: Normal range of motion and neck supple.     Comments: Right hand: TTP over dorsum, unable to clench fist without pain, no deformity noted.    Left knee: TTP over inferior aspect of patella, positive Lachman, negative anterior drawer, no soft tissue swelling noted.  Skin:    General: Skin is warm and dry.  Neurological:     General: No focal deficit present.     Mental Status: He is alert and oriented to person, place, and time.  Psychiatric:        Mood and Affect: Mood normal.        Behavior: Behavior normal.      UC Treatments / Results  Labs (all labs ordered are listed, but only abnormal results are displayed) Labs Reviewed - No data to display  EKG   Radiology DG Knee Complete 4 Views Left  Result Date: 11/17/2020 CLINICAL DATA:  Left knee pain.  No injury. EXAM: LEFT KNEE - COMPLETE 4+  VIEW COMPARISON:  None. FINDINGS: No fracture or bone lesion. Knee joint normally spaced and aligned. Tiny marginal osteophytes from the medial compartment and patella. No other degenerative/arthropathic change. Small joint effusion suspected. Soft tissues unremarkable. IMPRESSION: 1. No fracture or acute finding. 2. Minimal degenerative/arthropathic   changes reflected by tiny marginal osteophytes from the medial compartment and patella. 3. Small joint effusion. Electronically Signed   By: David  Ormond M.D.   On: 11/17/2020 14:09   DG Hand Complete Right  Result Date: 11/17/2020 CLINICAL DATA:  Right hand pain. Unable to grab a coffee cup. Symptoms for approximately 2 weeks. EXAM: RIGHT HAND - COMPLETE 3+ VIEW COMPARISON:  None. FINDINGS: There is no evidence of fracture or dislocation. There is no evidence of arthropathy or other focal bone abnormality. Soft tissues are unremarkable. IMPRESSION: Negative. Electronically Signed   By: David  Ormond M.D.   On: 11/17/2020 14:08    Procedures Procedures (including critical care time)  Medications Ordered in UC Medications - No data to display  Initial Impression / Assessment and Plan / UC Course  I have reviewed the triage vital signs and the nursing notes.  Pertinent labs & imaging results that were available during my care of the patient were reviewed by me and considered in my medical decision making (see chart for details).     MDM: 1.  Left knee pain, 2 Right hand pain.  Discharged home, hemodynamically stable.  We have referred him to J.  Schmitz, MD for further evaluation of left knee pain and right hand pain. Final Clinical Impressions(s) / UC Diagnoses   Final diagnoses:  Right hand pain  Acute pain of left knee     Discharge Instructions     Advise/strongly encourage patient to take blood pressure medication consistently on daily basis.  We will start 2-week trial of Diclofenac 75 mg.  We have advised/encouraged patient to  follow-up with orthopedic provider for further evaluation of left knee pain and right hand pain.  Work note provided per patient's request.     ED Prescriptions    Medication Sig Dispense Auth. Provider   diclofenac (VOLTAREN) 75 MG EC tablet Take 1 tablet (75 mg total) by mouth 2 (two) times daily for 10 days. 20 tablet Ragan, Michael, FNP     PDMP not reviewed this encounter.   Ragan, Michael, FNP 11/17/20 1434  

## 2020-11-17 NOTE — ED Triage Notes (Signed)
Patient c/o right hand pain that radiates up arm.  Patient is unable to grab a coffee cup due to pain.  Pain has been there about 2 weeks.  Patient also c/o left knee pain, no apparent injury.  There was an injury last year.  Patient has been taken [employer] w/o relief.
